# Patient Record
Sex: Male | Born: 1980 | Race: White | Hispanic: No | Marital: Married | Smoking: Current every day smoker
Health system: Southern US, Community
[De-identification: ages and names within clinical notes are randomized; demographics above are authoritative.]

## PROBLEM LIST (undated history)

## (undated) DIAGNOSIS — F319 Bipolar disorder, unspecified: Secondary | ICD-10-CM

---

## 2004-06-03 ENCOUNTER — Other Ambulatory Visit: Payer: Self-pay

## 2004-09-23 ENCOUNTER — Other Ambulatory Visit: Payer: Self-pay

## 2004-09-23 ENCOUNTER — Emergency Department: Payer: Self-pay | Admitting: Emergency Medicine

## 2005-01-24 ENCOUNTER — Emergency Department: Payer: Self-pay | Admitting: Unknown Physician Specialty

## 2005-05-29 ENCOUNTER — Emergency Department: Payer: Self-pay | Admitting: General Practice

## 2006-01-19 ENCOUNTER — Emergency Department: Payer: Self-pay | Admitting: Internal Medicine

## 2006-01-19 ENCOUNTER — Other Ambulatory Visit: Payer: Self-pay

## 2007-07-05 ENCOUNTER — Emergency Department: Payer: Self-pay | Admitting: Emergency Medicine

## 2007-07-06 ENCOUNTER — Emergency Department: Payer: Self-pay | Admitting: Emergency Medicine

## 2007-08-25 ENCOUNTER — Emergency Department: Payer: Self-pay | Admitting: Emergency Medicine

## 2007-11-04 ENCOUNTER — Emergency Department: Payer: Self-pay | Admitting: Emergency Medicine

## 2008-05-14 ENCOUNTER — Emergency Department (HOSPITAL_COMMUNITY): Admission: EM | Admit: 2008-05-14 | Discharge: 2008-05-15 | Payer: Self-pay | Admitting: Emergency Medicine

## 2008-05-14 ENCOUNTER — Emergency Department (HOSPITAL_COMMUNITY): Admission: EM | Admit: 2008-05-14 | Discharge: 2008-05-14 | Payer: Self-pay | Admitting: Emergency Medicine

## 2009-01-25 ENCOUNTER — Emergency Department (HOSPITAL_COMMUNITY): Admission: EM | Admit: 2009-01-25 | Discharge: 2009-01-26 | Payer: Self-pay | Admitting: Emergency Medicine

## 2009-05-18 ENCOUNTER — Emergency Department (HOSPITAL_COMMUNITY): Admission: EM | Admit: 2009-05-18 | Discharge: 2009-05-19 | Payer: Self-pay | Admitting: Emergency Medicine

## 2009-05-20 ENCOUNTER — Emergency Department (HOSPITAL_COMMUNITY): Admission: EM | Admit: 2009-05-20 | Discharge: 2009-05-20 | Payer: Self-pay | Admitting: Family Medicine

## 2010-10-13 ENCOUNTER — Emergency Department (HOSPITAL_COMMUNITY): Admission: EM | Admit: 2010-10-13 | Discharge: 2010-10-13 | Payer: Self-pay | Admitting: Emergency Medicine

## 2011-02-18 LAB — RAPID URINE DRUG SCREEN, HOSP PERFORMED
Amphetamines: NOT DETECTED
Benzodiazepines: NOT DETECTED
Tetrahydrocannabinol: NOT DETECTED

## 2011-03-25 LAB — CBC
MCV: 90.8 fL (ref 78.0–100.0)
Platelets: 158 10*3/uL (ref 150–400)
RBC: 5.18 MIL/uL (ref 4.22–5.81)
WBC: 5.8 10*3/uL (ref 4.0–10.5)

## 2011-03-25 LAB — COMPREHENSIVE METABOLIC PANEL
ALT: 25 U/L (ref 0–53)
AST: 46 U/L — ABNORMAL HIGH (ref 0–37)
Albumin: 3.9 g/dL (ref 3.5–5.2)
CO2: 26 mEq/L (ref 19–32)
Calcium: 8.6 mg/dL (ref 8.4–10.5)
Chloride: 97 mEq/L (ref 96–112)
Creatinine, Ser: 0.89 mg/dL (ref 0.4–1.5)
GFR calc Af Amer: >60 mL/min (ref >60)
GFR calc non Af Amer: >60 mL/min (ref >60)
Sodium: 133 mEq/L — ABNORMAL LOW (ref 135–145)

## 2011-03-25 LAB — URINALYSIS, ROUTINE W REFLEX MICROSCOPIC
Bilirubin Urine: NEGATIVE
Glucose, UA: NEGATIVE mg/dL
Hgb urine dipstick: NEGATIVE
Ketones, ur: NEGATIVE mg/dL
Specific Gravity, Urine: 1.017 (ref 1.005–1.030)
pH: 6.5 (ref 5.0–8.0)

## 2011-03-25 LAB — DIFFERENTIAL
Eosinophils Absolute: 0.2 10*3/uL (ref 0.0–0.7)
Eosinophils Relative: 3 % (ref 0–5)
Lymphocytes Relative: 38 % (ref 12–46)
Lymphs Abs: 2.2 10*3/uL (ref 0.7–4.0)
Monocytes Absolute: 0.7 10*3/uL (ref 0.1–1.0)

## 2011-09-04 LAB — COMPREHENSIVE METABOLIC PANEL
ALT: 17
BUN: 7
CO2: 30
Calcium: 9.2
Creatinine, Ser: 1.06
GFR calc non Af Amer: >60
Glucose, Bld: 84
Sodium: 137

## 2011-09-04 LAB — POCT I-STAT, CHEM 8
Calcium, Ion: 1.22
Creatinine, Ser: 1.2
Glucose, Bld: 33 — CL
Glucose, Bld: 71
HCT: 47
Hemoglobin: 16
Hemoglobin: 16
Sodium: 138
TCO2: 29
TCO2: 29

## 2011-09-04 LAB — URINALYSIS, ROUTINE W REFLEX MICROSCOPIC
Ketones, ur: 15 — AB
Leukocytes, UA: NEGATIVE
Nitrite: NEGATIVE
Protein, ur: 30 — AB
pH: 6.5

## 2011-09-04 LAB — DIFFERENTIAL
Eosinophils Absolute: 0.1
Lymphs Abs: 1.4
Neutro Abs: 9.2 — ABNORMAL HIGH
Neutrophils Relative %: 72

## 2011-09-04 LAB — CBC
HCT: 45.2
Hemoglobin: 16
MCHC: 35.3
MCV: 91.7
RBC: 4.93
RDW: 13.4

## 2011-09-04 LAB — LIPASE, BLOOD: Lipase: 21

## 2013-02-28 ENCOUNTER — Emergency Department: Payer: Self-pay | Admitting: Emergency Medicine

## 2013-04-23 ENCOUNTER — Emergency Department: Payer: Self-pay | Admitting: Emergency Medicine

## 2013-04-27 ENCOUNTER — Ambulatory Visit: Payer: Self-pay | Admitting: Family Medicine

## 2013-05-19 ENCOUNTER — Ambulatory Visit: Payer: Self-pay

## 2013-07-01 ENCOUNTER — Emergency Department: Payer: Self-pay | Admitting: Emergency Medicine

## 2013-07-02 ENCOUNTER — Emergency Department: Payer: Self-pay | Admitting: Emergency Medicine

## 2013-08-08 ENCOUNTER — Emergency Department: Payer: Self-pay | Admitting: Emergency Medicine

## 2013-08-20 ENCOUNTER — Emergency Department: Payer: Self-pay | Admitting: Emergency Medicine

## 2013-08-25 ENCOUNTER — Ambulatory Visit: Payer: Self-pay | Admitting: Physical Medicine and Rehabilitation

## 2014-01-02 ENCOUNTER — Emergency Department: Payer: Self-pay | Admitting: Emergency Medicine

## 2015-04-25 ENCOUNTER — Encounter: Payer: Self-pay | Admitting: *Deleted

## 2015-04-25 ENCOUNTER — Emergency Department
Admission: EM | Admit: 2015-04-25 | Discharge: 2015-04-25 | Disposition: A | Discharge status: Home / Self Care | Payer: Medicaid Other | Attending: Emergency Medicine | Admitting: Emergency Medicine

## 2015-04-25 DIAGNOSIS — M791 Myalgia, unspecified site: Secondary | ICD-10-CM

## 2015-04-25 DIAGNOSIS — Z79899 Other long term (current) drug therapy: Secondary | ICD-10-CM | POA: Insufficient documentation

## 2015-04-25 DIAGNOSIS — Z720 Tobacco use: Secondary | ICD-10-CM | POA: Diagnosis not present

## 2015-04-25 DIAGNOSIS — R5383 Other fatigue: Secondary | ICD-10-CM | POA: Diagnosis present

## 2015-04-25 HISTORY — DX: Bipolar disorder, unspecified: F31.9

## 2015-04-25 LAB — CBC
HCT: 44.6 % (ref 40.0–52.0)
HEMOGLOBIN: 15 g/dL (ref 13.0–18.0)
MCH: 31.4 pg (ref 26.0–34.0)
MCHC: 33.7 g/dL (ref 32.0–36.0)
MCV: 93.2 fL (ref 80.0–100.0)
Platelets: 165 10*3/uL (ref 150–440)
RBC: 4.79 MIL/uL (ref 4.40–5.90)
RDW: 12.9 % (ref 11.5–14.5)
WBC: 7.3 10*3/uL (ref 3.8–10.6)

## 2015-04-25 LAB — BASIC METABOLIC PANEL
Anion gap: 7 (ref 5–15)
BUN: 10 mg/dL (ref 6–20)
CALCIUM: 8.8 mg/dL — AB (ref 8.9–10.3)
CO2: 28 mmol/L (ref 22–32)
Chloride: 102 mmol/L (ref 101–111)
Creatinine, Ser: 0.91 mg/dL (ref 0.61–1.24)
GFR calc Af Amer: >60 mL/min (ref >60)
GFR calc non Af Amer: >60 mL/min (ref >60)
GLUCOSE: 91 mg/dL (ref 65–99)
Potassium: 3.5 mmol/L (ref 3.5–5.1)
Sodium: 137 mmol/L (ref 135–145)

## 2015-04-25 LAB — CK: Total CK: 319 U/L (ref 49–397)

## 2015-04-25 MED ORDER — KETOROLAC TROMETHAMINE 30 MG/ML IJ SOLN
30.0000 mg | Freq: Once | INTRAMUSCULAR | Status: AC
  Administered 2015-04-25: 30 mg via INTRAVENOUS

## 2015-04-25 MED ORDER — IBUPROFEN 800 MG PO TABS: 800.0000 mg | ORAL_TABLET | Freq: Three times a day (TID) | ORAL | Status: DC | PRN

## 2015-04-25 MED ORDER — KETOROLAC TROMETHAMINE 30 MG/ML IJ SOLN
INTRAMUSCULAR | Status: AC
  Administered 2015-04-25: 30 mg via INTRAVENOUS
  Filled 2015-04-25: qty 1

## 2015-04-25 MED ORDER — DOXYCYCLINE HYCLATE 100 MG PO CAPS: 100.0000 mg | ORAL_CAPSULE | Freq: Two times a day (BID) | ORAL | Status: DC

## 2015-04-25 MED ORDER — SODIUM CHLORIDE 0.9 % IV SOLN
Freq: Once | INTRAVENOUS | Status: AC
  Administered 2015-04-25: 13:00:00 via INTRAVENOUS

## 2015-04-25 NOTE — Discharge Instructions (Signed)

## 2015-04-25 NOTE — ED Notes (Signed)
Pt stable for discharge, prescriptions provided.

## 2015-04-25 NOTE — ED Provider Notes (Addendum)
[facility] Emergency Department Provider Note     Time seen: 12:45 PM  I have reviewed the triage vital signs and the nursing notes.   HISTORY  Chief Complaint Fatigue    HPI Carl Carlson is a 34 y.o. male resents for aching and not feeling well. Patient reports he can't sleep very well, he thinks his been going on since taken Seroquel for the last couple years. States he only takes half tablet because all tablet makes him sleep to much. He is unable to think as clearly as he normally does, it is been forgetful. His also on an antidepressant meters or murmur the name. Aching is generalized nothing makes it better or worse.     Past Medical History  Diagnosis Date  . Bipolar 1 disorder     There are no active problems to display for this patient.   History reviewed. No pertinent past surgical history.  Current Outpatient Rx  Name  Route  Sig  Dispense  Refill  . QUEtiapine (SEROQUEL) 50 MG tablet   Oral   Take 150 mg by mouth at bedtime.           Allergies Penicillins and Septra  No family history on file.  Social History History  Substance Use Topics  . Smoking status: Current Every Day Smoker  . Smokeless tobacco: Not on file  . Alcohol Use: Yes     Comment: no etoh past 2 weeks, used to drink a case of beer over a few days    Review of Systems Constitutional: Negative for fever. Eyes: Negative for visual changes. ENT: Negative for sore throat. Cardiovascular: Negative for chest pain. Respiratory: Negative for shortness of breath. Gastrointestinal: Negative for abdominal pain, vomiting and diarrhea. Genitourinary: Negative for dysuria. Musculoskeletal: Positive for generalized  aches and pains Skin: Negative for rash. Neurological: Negative for headaches, focal weakness or numbness.  10-point ROS otherwise negative.  ____________________________________________   PHYSICAL EXAM:  VITAL SIGNS: ED Triage Vitals   Enc Vitals Group     BP 04/25/15 1151 125/75 mmHg     Pulse Rate 04/25/15 1151 57     Resp 04/25/15 1151 18     Temp 04/25/15 1151 98.2 F (36.8 C)     Temp Source 04/25/15 1151 Oral     SpO2 04/25/15 1151 99 %     Weight 04/25/15 1151 180 lb (81.647 kg)     Height 04/25/15 1151 6\' 3"  (1.905 m)     Head Cir --      Peak Flow --      Pain Score 04/25/15 1211 5     Pain Loc --      Pain Edu? --      Excl. in GC? --     Constitutional: Alert and oriented. Well appearing and in no distress. Eyes: Conjunctivae are normal. PERRL. Normal extraocular movements. ENT   Head: Normocephalic and atraumatic.   Nose: No congestion/rhinnorhea.   Mouth/Throat: Mucous membranes are moist.   Neck: No stridor. Hematological/Lymphatic/Immunilogical: No cervical lymphadenopathy. Cardiovascular: Normal rate, regular rhythm. Normal and symmetric distal pulses are present in all extremities. No murmurs, rubs, or gallops. Respiratory: Normal respiratory effort without tachypnea nor retractions. Breath sounds are clear and equal bilaterally. No wheezes/rales/rhonchi. Gastrointestinal: Soft and nontender. No distention. No abdominal bruits. There is no CVA tenderness. Musculoskeletal: Nontender with normal range of motion in all extremities. No joint effusions.  No lower extremity tenderness nor edema. Neurologic:  Normal speech and  language. No gross focal neurologic deficits are appreciated. Speech is normal. No gait instability. Skin:  Patient does have several skin lesions that he notes were from recent tick bites, no rashes appreciated. Psychiatric: Mood and affect are normal. Speech and behavior are normal. Patient exhibits appropriate insight and judgment.  ____________________________________________    LABS (pertinent positives/negatives)  Labs Reviewed  BASIC METABOLIC PANEL - Abnormal; Notable for the following:    Calcium 8.8 (*)    All other components within normal limits   CK  CBC    ____________________________________________  ED COURSE:  Pertinent labs & imaging results that were available during my care of the patient were reviewed by me and considered in my medical decision making (see chart for details).  Given IV fluid bolus, Toradol and check lab works including CK level ____________________________________________   RADIOLOGY  None  ____________________________________________    FINAL ASSESSMENT AND PLAN  Myalgias  Plan: I'm unsure the exact etiology of his myalgias. I'll assume that is at least possible this could be a tickborne illness related. He has had no rash no fever, but he said significant myalgias which have been unusual for the last 2-3 weeks. He does note tick bites were at least 3 weeks ago. [place], we'll try doxycycline and have him follow-up either here or with his regular doctor is not improving.    Emily FilbertWilliams, Jonathan E, MD   Emily FilbertJonathan E Williams, MD 04/25/15 1350  Emily FilbertJonathan E Williams, MD 04/25/15 1400

## 2015-04-25 NOTE — ED Notes (Signed)
Reports feeling achy and not feeling well. Reports not being able to sleep well. Thinks its because of Seroquel.  Takes only a half a tablet because a whole tablet makes him sleep too much. States he is not able to think as clearly as he normally does.  He is forgetful some days. Has been on Seroquel for a couple of years 150mg  but only takes 75mg . He is also on an antidepressant as well does not know the name of it however. Sees Dr. Lesle ChrisLatiff on [address].

## 2015-05-24 ENCOUNTER — Encounter: Payer: Self-pay | Admitting: *Deleted

## 2015-05-24 ENCOUNTER — Emergency Department
Admission: EM | Admit: 2015-05-24 | Discharge: 2015-05-24 | Disposition: A | Discharge status: Home / Self Care | Payer: Medicaid Other | Attending: Emergency Medicine | Admitting: Emergency Medicine

## 2015-05-24 ENCOUNTER — Emergency Department: Payer: Medicaid Other

## 2015-05-24 DIAGNOSIS — Z880 Allergy status to penicillin: Secondary | ICD-10-CM | POA: Diagnosis not present

## 2015-05-24 DIAGNOSIS — Z720 Tobacco use: Secondary | ICD-10-CM | POA: Diagnosis not present

## 2015-05-24 DIAGNOSIS — J189 Pneumonia, unspecified organism: Secondary | ICD-10-CM

## 2015-05-24 DIAGNOSIS — R509 Fever, unspecified: Secondary | ICD-10-CM | POA: Diagnosis present

## 2015-05-24 DIAGNOSIS — J159 Unspecified bacterial pneumonia: Secondary | ICD-10-CM | POA: Diagnosis not present

## 2015-05-24 DIAGNOSIS — R079 Chest pain, unspecified: Secondary | ICD-10-CM | POA: Insufficient documentation

## 2015-05-24 MED ORDER — ONDANSETRON 8 MG PO TBDP
8.0000 mg | ORAL_TABLET | Freq: Once | ORAL | Status: AC
  Administered 2015-05-24: 8 mg via ORAL

## 2015-05-24 MED ORDER — PSEUDOEPH-BROMPHEN-DM 30-2-10 MG/5ML PO SYRP: 5.0000 mL | ORAL_SOLUTION | Freq: Four times a day (QID) | ORAL | Status: DC | PRN

## 2015-05-24 MED ORDER — KETOROLAC TROMETHAMINE 60 MG/2ML IM SOLN
INTRAMUSCULAR | Status: AC
  Administered 2015-05-24: 60 mg via INTRAMUSCULAR
  Filled 2015-05-24: qty 2

## 2015-05-24 MED ORDER — KETOROLAC TROMETHAMINE 60 MG/2ML IM SOLN
60.0000 mg | Freq: Once | INTRAMUSCULAR | Status: AC
Start: 2015-05-24 — End: 2015-05-24
  Administered 2015-05-24: 60 mg via INTRAMUSCULAR

## 2015-05-24 MED ORDER — IBUPROFEN 800 MG PO TABS: 800.0000 mg | ORAL_TABLET | Freq: Three times a day (TID) | ORAL | Status: DC | PRN

## 2015-05-24 MED ORDER — ONDANSETRON 4 MG PO TBDP
ORAL_TABLET | ORAL | Status: AC
  Administered 2015-05-24: 8 mg via ORAL
  Filled 2015-05-24: qty 2

## 2015-05-24 MED ORDER — AZITHROMYCIN 250 MG PO TABS: ORAL_TABLET | ORAL | Status: AC

## 2015-05-24 NOTE — ED Notes (Signed)
Pt reports fever, body aches, chills, nausea and vomiting for the last week

## 2015-05-24 NOTE — ED Provider Notes (Signed)
[facility] Emergency Department Provider Note  ____________________________________________  Time seen: Approximately 6:44 PM  I have reviewed the triage vital signs and the nursing notes.   HISTORY  Chief Complaint Chills    HPI SHASHWAT CLEARY is a 34 y.o. male complaining of 1 week of fever bodyaches chills nausea and vomiting. The patient say his symptoms wax and wane. State he was given Benadryl last 2 days but today everything became worse. Patient said his last vomiting episode was prior to coming to the emergency room. Patient also state he left work early today secondary to his complaint. Patient rates his pain as a 10 over 10.   Past Medical History  Diagnosis Date  . Bipolar 1 disorder     There are no active problems to display for this patient.   History reviewed. No pertinent past surgical history.  Current Outpatient Rx  Name  Route  Sig  Dispense  Refill  . doxycycline (VIBRAMYCIN) 100 MG capsule   Oral   Take 1 capsule (100 mg total) by mouth 2 (two) times daily.   28 capsule   0   . ibuprofen (ADVIL,MOTRIN) 800 MG tablet   Oral   Take 1 tablet (800 mg total) by mouth every 8 (eight) hours as needed.   30 tablet   0   . QUEtiapine (SEROQUEL) 50 MG tablet   Oral   Take 150 mg by mouth at bedtime.           Allergies Penicillins and Septra  No family history on file.  Social History History  Substance Use Topics  . Smoking status: Current Every Day Smoker -- 0.50 packs/day  . Smokeless tobacco: Not on file  . Alcohol Use: Yes     Comment: no etoh past 2 weeks, used to drink a case of beer over a few days    Review of Systems Constitutional: Fever chills and malaise  Eyes: No visual changes. ENT: No sore throat. Cardiovascular: Chest pain secondary to cough Respiratory: Denies shortness of breath. States deep breath causes increased coughing Gastrointestinal: No abdominal pain. States nausea and vomiting but  no diarrhea. No constipation. Genitourinary: Negative for dysuria. Musculoskeletal: Generalized joint pain Skin: Negative for rash. Neurological: Negative for headaches, focal weakness or numbness.  10-point ROS otherwise negative.  ____________________________________________   PHYSICAL EXAM:  VITAL SIGNS: ED Triage Vitals  Enc Vitals Group     BP 05/24/15 1758 140/62 mmHg     Pulse Rate 05/24/15 1758 105     Resp --      Temp 05/24/15 1758 100.3 F (37.9 C)     Temp Source 05/24/15 1758 Oral     SpO2 05/24/15 1758 96 %     Weight 05/24/15 1758 180 lb (81.647 kg)     Height 05/24/15 1758  (1.88 m)     Head Cir --      Peak Flow --      Pain Score 05/24/15 1759 10     Pain Loc --      Pain Edu? --      Excl. in GC? --     Constitutional: Alert and oriented. Mild distress. Low-grade fever Eyes: Conjunctivae are normal. PERRL. EOMI. Head: Atraumatic. Nose: Edematous nasal turbinates with clear rhinorrhea. Mouth/Throat: Mucous membranes are moist.  Oropharynx non-erythematous. Neck: No stridor.  No deformity for nuchal range of motion nontender palpation Hematological/Lymphatic/Immunilogical: No cervical lymphadenopathy. Cardiovascular: Normal rate, regular rhythm. Grossly normal heart sounds.  Good peripheral circulation. Mildly tachycardic Respiratory: Normal respiratory effort.  No retractions. Lungs bilateral Rales. Gastrointestinal: Soft and nontender. No distention. No abdominal bruits. No CVA tenderness. Musculoskeletal: No lower extremity tenderness nor edema.  No joint effusions. Neurologic:  Normal speech and language. No gross focal neurologic deficits are appreciated. Speech is normal. No gait instability. Skin:  Skin is warm, dry and intact. No rash noted. Psychiatric: Mood and affect are normal. Speech and behavior are normal.  ____________________________________________   LABS (all labs ordered are listed, but only abnormal results are  displayed)  Labs Reviewed - No data to display ____________________________________________  EKG   ____________________________________________  RADIOLOGY  Right perihilar pneumonia ____________________________________________   PROCEDURES  Procedure(s) performed: None  Critical Care performed: No  ____________________________________________   INITIAL IMPRESSION / ASSESSMENT AND PLAN / ED COURSE  Pertinent labs & imaging results that were available during my care of the patient were reviewed by me and considered in my medical decision making (see chart for details).  Viral illness ____________________________________________   FINAL CLINICAL IMPRESSION(S) / ED DIAGNOSES  Final diagnoses:  Community acquired pneumonia      Joni Reining, PA-C 05/24/15 1939  Governor Rooks, MD 05/24/15 [phone]

## 2016-04-23 ENCOUNTER — Encounter: Payer: Self-pay | Admitting: Emergency Medicine

## 2016-04-23 ENCOUNTER — Emergency Department
Admission: EM | Admit: 2016-04-23 | Discharge: 2016-04-23 | Disposition: A | Discharge status: Home / Self Care | Payer: Medicaid Other | Attending: Emergency Medicine | Admitting: Emergency Medicine

## 2016-04-23 DIAGNOSIS — F319 Bipolar disorder, unspecified: Secondary | ICD-10-CM | POA: Diagnosis not present

## 2016-04-23 DIAGNOSIS — N4889 Other specified disorders of penis: Secondary | ICD-10-CM

## 2016-04-23 DIAGNOSIS — A6920 Lyme disease, unspecified: Secondary | ICD-10-CM

## 2016-04-23 DIAGNOSIS — F17200 Nicotine dependence, unspecified, uncomplicated: Secondary | ICD-10-CM | POA: Insufficient documentation

## 2016-04-23 MED ORDER — DEXTROSE 5 % IV SOLN
2.0000 g | Freq: Once | INTRAVENOUS | Status: AC
  Administered 2016-04-23: 2 g via INTRAVENOUS
  Filled 2016-04-23: qty 2

## 2016-04-23 MED ORDER — PREDNISONE 20 MG PO TABS: 40.0000 mg | ORAL_TABLET | Freq: Every day | ORAL | Status: DC

## 2016-04-23 MED ORDER — METHYLPREDNISOLONE SODIUM SUCC 125 MG IJ SOLR
125.0000 mg | Freq: Once | INTRAMUSCULAR | Status: AC
  Administered 2016-04-23: 125 mg via INTRAVENOUS

## 2016-04-23 MED ORDER — DIPHENHYDRAMINE HCL 50 MG/ML IJ SOLN
INTRAMUSCULAR | Status: AC
  Administered 2016-04-23: 50 mg via INTRAVENOUS
  Filled 2016-04-23: qty 1

## 2016-04-23 MED ORDER — METHYLPREDNISOLONE SODIUM SUCC 125 MG IJ SOLR
INTRAMUSCULAR | Status: AC
  Administered 2016-04-23: 125 mg via INTRAVENOUS
  Filled 2016-04-23: qty 2

## 2016-04-23 MED ORDER — FAMOTIDINE IN NACL 20-0.9 MG/50ML-% IV SOLN
20.0000 mg | Freq: Once | INTRAVENOUS | Status: AC
  Administered 2016-04-23: 20 mg via INTRAVENOUS

## 2016-04-23 MED ORDER — DOXYCYCLINE HYCLATE 100 MG PO TABS: 100.0000 mg | ORAL_TABLET | Freq: Two times a day (BID) | ORAL | Status: DC

## 2016-04-23 MED ORDER — DIPHENHYDRAMINE HCL 50 MG/ML IJ SOLN
50.0000 mg | Freq: Once | INTRAMUSCULAR | Status: AC
  Administered 2016-04-23: 50 mg via INTRAVENOUS

## 2016-04-23 MED ORDER — FAMOTIDINE IN NACL 20-0.9 MG/50ML-% IV SOLN
INTRAVENOUS | Status: AC
  Administered 2016-04-23: 20 mg via INTRAVENOUS
  Filled 2016-04-23: qty 50

## 2016-04-23 NOTE — ED Notes (Signed)
Pt pulled a tick of his penis x2 days ago , yesterday the penis is swollen and painful, no difficulty urinating

## 2016-04-23 NOTE — ED Notes (Signed)
Pt reports itching, red rash to abdomen. MD to bedside, new orders placed.

## 2016-04-23 NOTE — ED Provider Notes (Signed)
[facility] Emergency Department Provider Note   ____________________________________________  Time seen: Approximately 9 AM  I have reviewed the triage vital signs and the nursing notes.   HISTORY  Chief Complaint Insect Bite   HPI Carl Carlson is a 35 y.o. male with a history of bipolar disorder who is presenting the emergency department with a swollen penis after being bitten by ticks 2 days ago. He said that he found 2 ticks on the tip of his penis. He removed both. He says that since then he has had penile swelling. He was seen at an urgent care yesterday where they sent blood work. The patient is unsure what blood tests they took.  He was also given minus cycling and recommended to put ice on his penis to help with swelling. He says that since then the swelling has worsened. He denies any pain. He also says the swelling to the right side of his groin. Denies any pain when urinating. Patient says he is also done a complete tick check of his armpits as well as his hair and groin and has not found any additional insects.   Past Medical History  Diagnosis Date  . Bipolar 1 disorder (HCC)     There are no active problems to display for this patient.   History reviewed. No pertinent past surgical history.  Current Outpatient Rx  Name  Route  Sig  Dispense  Refill  . minocycline (MINOCIN,DYNACIN) 100 MG capsule   Oral   Take 100 mg by mouth 2 (two) times daily.           Allergies Penicillins and Septra  No family history on file.  Social History Social History  Substance Use Topics  . Smoking status: Current Every Day Smoker -- 0.50 packs/day  . Smokeless tobacco: None  . Alcohol Use: Yes     Comment: no etoh past 2 weeks, used to drink a case of beer over a few days    Review of Systems Constitutional: No fever/chills Eyes: No visual changes. ENT: No sore throat. Cardiovascular: Denies chest pain. Respiratory: Denies shortness  of breath. Gastrointestinal: No abdominal pain.  No nausea, no vomiting.  No diarrhea.  No constipation. Genitourinary: Negative for dysuria. Musculoskeletal: Negative for back pain. Skin: As above Neurological: Negative for headaches, focal weakness or numbness.  10-point ROS otherwise negative.  ____________________________________________   PHYSICAL EXAM:  VITAL SIGNS: ED Triage Vitals  Enc Vitals Group     BP 04/23/16 0810 139/82 mmHg     Pulse Rate 04/23/16 0810 68     Resp 04/23/16 0810 18     Temp 04/23/16 0810 98 F (36.7 C)     Temp Source 04/23/16 0810 Oral     SpO2 04/23/16 0810 100 %     Weight 04/23/16 0810 183 lb (83.008 kg)     Height 04/23/16 0810  (1.905 m)     Head Cir --      Peak Flow --      Pain Score 04/23/16 0811 8     Pain Loc --      Pain Edu? --      Excl. in GC? --     Constitutional: Alert and oriented. Well appearing and in no acute distress. Eyes: Conjunctivae are normal. PERRL. EOMI. Head: Atraumatic. Nose: No congestion/rhinnorhea. Mouth/Throat: Mucous membranes are moist.  Neck: No stridor.   Cardiovascular: Normal rate, regular rhythm. Grossly normal heart sounds.   Respiratory: Normal respiratory  effort.  No retractions. Lungs CTAB. Gastrointestinal: Soft and nontender. No distention.  Genitourinary:  Penis is edematous and swollen especially to the glans and just before the glans. The patient is circumcised. There is also a 1 cm, rounded blackened scab lesion to the dorsal of the glans. There do not appear to be any bug parts embedded in the penis around this scabbed area in addition to an area just proximal on the anterior side of the penis to the glans. No tenderness to palpation. Palpable right inguinal lymph node which is mildly tender. Musculoskeletal: No lower extremity tenderness nor edema.  No joint effusions. Neurologic:  Normal speech and language. No gross focal neurologic deficits are appreciated.  Skin:  Skin is  warm, dry and intact. No rash noted. Psychiatric: Mood and affect are normal. Speech and behavior are normal.  ____________________________________________   LABS (all labs ordered are listed, but only abnormal results are displayed)  Labs Reviewed  LYME DISEASE DNA BY PCR(BORRELIA BURG)   ____________________________________________  EKG   ____________________________________________  RADIOLOGY   ____________________________________________   PROCEDURES    ____________________________________________   INITIAL IMPRESSION / ASSESSMENT AND PLAN / ED COURSE  Pertinent labs & imaging results that were available during my care of the patient were reviewed by me and considered in my medical decision making (see chart for details).  ----------------------------------------- 10:07 AM on 04/23/2016 -----------------------------------------  Patient's penis wrapped with an Ace wrap for compression for about 30 minutes without resolution of swelling. Also given Rocephin. Has a penicillin allergy listed but only hives as a reaction. The patient confirms this. However, he developed hives and a rash with itching with the Rocephin. No airway compromise. Given Benadryl as well as Pepcid and methylprednisolone with resolution of the itching. We'll monitor. I'm suspecting Lyme disease with possible cellulitis secondary to the insect bite. We'll give ceftriaxone as a single Dose and changes minocycline to doxycycline.  ----------------------------------------- 11:40 AM on 04/23/2016 -----------------------------------------  Patient had hives after ceftriaxone. Given that a drill as well as Pepcid and methylprednisolone and now is no longer itching and the rash has resolved. Penis still is swelling but without any worsening. Still nontender. Plan the patient and he'll be discharged home.   ____________________________________________   FINAL CLINICAL IMPRESSION(S) / ED  DIAGNOSES  Penile edema. Presumptive treatment for Lyme disease.    NEW MEDICATIONS STARTED DURING THIS VISIT:  New Prescriptions   No medications on file     Note:  This document was prepared using Dragon voice recognition software and may include unintentional dictation errors.    Myrna Blazeravid Matthew Jazon Jipson, MD 04/23/16 [phone]

## 2016-04-25 LAB — LYME DISEASE DNA BY PCR(BORRELIA BURG): Lyme Disease(B.burgdorferi)PCR: NEGATIVE

## 2016-09-21 ENCOUNTER — Emergency Department
Admission: EM | Admit: 2016-09-21 | Discharge: 2016-09-21 | Disposition: A | Discharge status: Home / Self Care | Payer: Medicaid Other | Attending: Emergency Medicine | Admitting: Emergency Medicine

## 2016-09-21 ENCOUNTER — Encounter: Payer: Self-pay | Admitting: Emergency Medicine

## 2016-09-21 ENCOUNTER — Emergency Department: Payer: Medicaid Other

## 2016-09-21 DIAGNOSIS — S99921A Unspecified injury of right foot, initial encounter: Secondary | ICD-10-CM | POA: Diagnosis present

## 2016-09-21 DIAGNOSIS — Y9301 Activity, walking, marching and hiking: Secondary | ICD-10-CM | POA: Diagnosis not present

## 2016-09-21 DIAGNOSIS — Y929 Unspecified place or not applicable: Secondary | ICD-10-CM | POA: Insufficient documentation

## 2016-09-21 DIAGNOSIS — W1842XA Slipping, tripping and stumbling without falling due to stepping into hole or opening, initial encounter: Secondary | ICD-10-CM | POA: Diagnosis not present

## 2016-09-21 DIAGNOSIS — F17200 Nicotine dependence, unspecified, uncomplicated: Secondary | ICD-10-CM | POA: Diagnosis not present

## 2016-09-21 DIAGNOSIS — Y999 Unspecified external cause status: Secondary | ICD-10-CM | POA: Insufficient documentation

## 2016-09-21 DIAGNOSIS — S93601A Unspecified sprain of right foot, initial encounter: Secondary | ICD-10-CM | POA: Insufficient documentation

## 2016-09-21 MED ORDER — NABUMETONE 750 MG PO TABS
750.0000 mg | ORAL_TABLET | Freq: Two times a day (BID) | ORAL | 0 refills | Status: DC
Start: 2016-09-21 — End: 2016-10-14

## 2016-09-21 MED ORDER — CYCLOBENZAPRINE HCL 5 MG PO TABS: 5.0000 mg | ORAL_TABLET | Freq: Three times a day (TID) | ORAL | 0 refills | Status: DC | PRN

## 2016-09-21 NOTE — ED Provider Notes (Signed)
[facility] Emergency Department Provider Note ____________________________________________  Time seen: 1412   I have reviewed the triage vital signs and the nursing notes.  HISTORY  Chief Complaint  Ankle Pain  HPI Carl Carlson is a 35 y.o. male reports pain to the lateral right foot after he stepped into a hole while walking across the yard yesterday. He was wearing his tennis shoes when rolled his ankle after stepping in the hole. He complains of lateral foot pain since then. He denies any other injury at this time.   Past Medical History:  Diagnosis Date  . Bipolar 1 disorder (HCC)     There are no active problems to display for this patient.  History reviewed. No pertinent surgical history.  Prior to Admission medications   Medication Sig Start Date End Date Taking? Authorizing Provider  cyclobenzaprine (FLEXERIL) 5 MG tablet Take 1 tablet (5 mg total) by mouth 3 (three) times daily as needed for muscle spasms. 09/21/16   Atif Chapple V Bacon Chistine Dematteo, PA-C  doxycycline (VIBRA-TABS) 100 MG tablet Take 1 tablet (100 mg total) by mouth 2 (two) times daily. 04/23/16   Myrna Blazer, MD  minocycline (MINOCIN,DYNACIN) 100 MG capsule Take 100 mg by mouth 2 (two) times daily.    Historical Provider, MD  nabumetone (RELAFEN) 750 MG tablet Take 1 tablet (750 mg total) by mouth 2 (two) times daily. 09/21/16   Gabor Lusk V Bacon Marian Meneely, PA-C  predniSONE (DELTASONE) 20 MG tablet Take 2 tablets (40 mg total) by mouth daily with breakfast. 04/23/16   Myrna Blazer, MD   Allergies Cephalosporins; Penicillins; and Septra [sulfamethoxazole-trimethoprim]  History reviewed. No pertinent family history.  Social History Social History  Substance Use Topics  . Smoking status: Current Every Day Smoker    Packs/day: 0.50  . Smokeless tobacco: Not on file  . Alcohol use Yes     Comment: no etoh past 2 weeks, used to drink a case of beer over a few days    Review of Systems  Constitutional: Negative for fever. Musculoskeletal: Negative for back pain. Left foot pain  Skin: Negative for rash. Neurological: Negative for headaches, focal weakness or numbness. ____________________________________________  PHYSICAL EXAM:  VITAL SIGNS: ED Triage Vitals  Enc Vitals Group     BP 09/21/16 1307 123/83     Pulse Rate 09/21/16 1307 (!) 102     Resp 09/21/16 1307 16     Temp 09/21/16 1307 98.1 F (36.7 C)     Temp Source 09/21/16 1307 Oral     SpO2 09/21/16 1307 96 %     Weight 09/21/16 1304 182 lb (82.6 kg)     Height 09/21/16 1304 6\' 2"  (1.88 m)     Head Circumference --      Peak Flow --      Pain Score 09/21/16 1304 10     Pain Loc --      Pain Edu? --      Excl. in GC? --    Constitutional: Alert and oriented. Well appearing and in no distress. Head: Normocephalic and atraumatic. Musculoskeletal: Right foot without obvious deformity, dislocation, or edema. No local erythema, abrasion, or swelling. Minimally tender to palp over the dorsolateral right foot. Nontender with normal range of motion in all extremities.  Neurologic: Normal speech and language. No gross focal neurologic deficits are appreciated. Skin:  Skin is warm, dry and intact. No rash noted. ____________________________________________   RADIOLOGY  Right Foot Negative  I, Favour Aleshire  Marcelyn BruinsV Bacon, personally viewed and evaluated these images (plain radiographs) as part of my medical decision making, as well as reviewing the written report by the radiologist. ___________________________________________  PROCEDURES  Ace bandage right foot ____________________________________________  INITIAL IMPRESSION / ASSESSMENT AND PLAN / ED COURSE  Patient with a sprain to the right foot without obvious fracture dislocation radiology. He is fitted with an Ace wrap and given instruction on RICE management of injury. He will follow up with Dr. Orland Jarredroxler podiatry for ongoing  management.  Clinical Course   ____________________________________________  FINAL CLINICAL IMPRESSION(S) / ED DIAGNOSES  Final diagnoses:  Sprain of right foot, initial encounter      Lissa HoardJenise V Bacon Coleton Woon, PA-C 09/21/16 1632    Sharyn CreamerMark Quale, MD 09/21/16 2201

## 2016-09-21 NOTE — ED Triage Notes (Signed)
Pt stepped in hole with right foot. C/o ankle pain. Denies swelling.

## 2016-09-21 NOTE — Discharge Instructions (Signed)
Your x-ray does not show a fracture to the foot. You have a sprain and should perform activities as tolerated. Take the prescription meds as directed. Follow-up with Dr. Orland Jarredroxler for continued symptoms.

## 2016-09-21 NOTE — ED Notes (Addendum)
States he stepped in a hole yesterday  having pain to right lateral foot ankle area  No  Swelling noted to ankle /foot  Positives pulses  But unable to beat wt

## 2016-10-14 ENCOUNTER — Ambulatory Visit
Admission: EM | Admit: 2016-10-14 | Discharge: 2016-10-14 | Disposition: A | Discharge status: Home / Self Care | Payer: Medicaid Other | Attending: Family Medicine | Admitting: Family Medicine

## 2016-10-14 DIAGNOSIS — T300 Burn of unspecified body region, unspecified degree: Secondary | ICD-10-CM

## 2016-10-14 DIAGNOSIS — L03115 Cellulitis of right lower limb: Secondary | ICD-10-CM | POA: Diagnosis not present

## 2016-10-14 MED ORDER — KETOROLAC TROMETHAMINE 60 MG/2ML IM SOLN
60.0000 mg | Freq: Once | INTRAMUSCULAR | Status: AC
  Administered 2016-10-14: 60 mg via INTRAMUSCULAR

## 2016-10-14 MED ORDER — TETANUS-DIPHTH-ACELL PERTUSSIS 5-2.5-18.5 LF-MCG/0.5 IM SUSP
0.5000 mL | Freq: Once | INTRAMUSCULAR | Status: AC
  Administered 2016-10-14: 0.5 mL via INTRAMUSCULAR

## 2016-10-14 MED ORDER — CLINDAMYCIN HCL 300 MG PO CAPS: 300.0000 mg | ORAL_CAPSULE | Freq: Three times a day (TID) | ORAL | 0 refills | Status: DC

## 2016-10-14 MED ORDER — HYDROCODONE-ACETAMINOPHEN 5-325 MG PO TABS: 1.0000 | ORAL_TABLET | Freq: Four times a day (QID) | ORAL | 0 refills | Status: DC | PRN
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
## 2016-10-17 ENCOUNTER — Telehealth: Payer: Self-pay | Admitting: *Deleted

## 2017-11-06 ENCOUNTER — Other Ambulatory Visit: Payer: Self-pay

## 2017-11-06 ENCOUNTER — Emergency Department
Admission: EM | Admit: 2017-11-06 | Discharge: 2017-11-06 | Disposition: A | Discharge status: Home / Self Care | Payer: Medicaid Other | Attending: Emergency Medicine | Admitting: Emergency Medicine

## 2017-11-06 ENCOUNTER — Encounter: Payer: Self-pay | Admitting: Emergency Medicine

## 2017-11-06 DIAGNOSIS — F17200 Nicotine dependence, unspecified, uncomplicated: Secondary | ICD-10-CM | POA: Insufficient documentation

## 2017-11-06 DIAGNOSIS — L509 Urticaria, unspecified: Secondary | ICD-10-CM | POA: Diagnosis present

## 2017-11-06 DIAGNOSIS — T781XXA Other adverse food reactions, not elsewhere classified, initial encounter: Secondary | ICD-10-CM | POA: Diagnosis not present

## 2017-11-06 MED ORDER — FAMOTIDINE 20 MG PO TABS
20.0000 mg | ORAL_TABLET | Freq: Once | ORAL | Status: AC
  Administered 2017-11-06: 20 mg via ORAL
  Filled 2017-11-06: qty 1

## 2017-11-06 MED ORDER — PREDNISONE 50 MG PO TABS: 50.0000 mg | ORAL_TABLET | Freq: Every day | ORAL | 0 refills | Status: DC

## 2017-11-06 MED ORDER — METHYLPREDNISOLONE SODIUM SUCC 125 MG IJ SOLR
125.0000 mg | Freq: Once | INTRAMUSCULAR | Status: AC
  Administered 2017-11-06: 125 mg via INTRAMUSCULAR
  Filled 2017-11-06: qty 2

## 2017-11-06 MED ORDER — DIPHENHYDRAMINE HCL 50 MG/ML IJ SOLN
50.0000 mg | Freq: Once | INTRAMUSCULAR | Status: AC
  Administered 2017-11-06: 50 mg via INTRAMUSCULAR
  Filled 2017-11-06: qty 1

## 2017-11-06 NOTE — ED Provider Notes (Signed)
[facility] Emergency Department Provider Note  ____________________________________________  Time seen: Approximately 8:53 PM  I have reviewed the triage vital signs and the nursing notes.   HISTORY  Chief Complaint Urticaria    HPI Carl Carlson is a 36 y.o. male who presents emergency department for hives and itching to his right side of the body.  Patient reports that he ate 2 slices of [employer] pizza and broke out into hives.  Patient denies any history of allergic reaction to his speech.  He has a history of allergic reactions to cephalosporins, penicillins, Bactrim.  Has not had any food allergies.  Patient denies any difficulty breathing or swallowing, no swelling of the face, lips, tongue.  Patient has not tried any medications for his allergic reaction prior to arrival.  No other complaints at this time.  Past Medical History:  Diagnosis Date  . Bipolar 1 disorder (HCC)     There are no active problems to display for this patient.   History reviewed. No pertinent surgical history.  Prior to Admission medications   Medication Sig Start Date End Date Taking? Authorizing Provider  clindamycin (CLEOCIN) 300 MG capsule Take 1 capsule (300 mg total) by mouth 3 (three) times daily. 10/14/16   Payton Mccallumonty, Orlando, MD  HYDROcodone-acetaminophen (NORCO/VICODIN) 5-325 MG tablet Take 1-2 tablets by mouth every 6 (six) hours as needed. 10/14/16   Payton Mccallumonty, [place], MD  ibuprofen (ADVIL,MOTRIN) 800 MG tablet Take 800 mg by mouth every 8 (eight) hours as needed.    [provider]  predniSONE (DELTASONE) 50 MG tablet Take 1 tablet (50 mg total) by mouth daily with breakfast. 11/06/17   Cuthriell, Delorise RoyalsJonathan D, PA-C    Allergies Cephalosporins; Penicillins; and Septra [sulfamethoxazole-trimethoprim]  No family history on file.  Social History Social History   Tobacco Use  . Smoking status: Current Every Day Smoker    Packs/day: 0.50  . Smokeless tobacco:  Never Used  Substance Use Topics  . Alcohol use: Yes    Comment: no etoh past 2 weeks, used to drink a case of beer over a few days  . Drug use: No     Review of Systems  Constitutional: No fever/chills Eyes: No visual changes. No discharge ENT: No upper respiratory complaints. Cardiovascular: no chest pain. Respiratory: no cough. No SOB. Gastrointestinal: No abdominal pain.  No nausea, no vomiting.   Musculoskeletal: Negative for musculoskeletal pain. Skin: Positive for hives and pruritus to the right side of body Neurological: Negative for headaches, focal weakness or numbness. 10-point ROS otherwise negative.  ____________________________________________   PHYSICAL EXAM:  VITAL SIGNS: ED Triage Vitals  Enc Vitals Group     BP 11/06/17 2042 (!) 145/88     Pulse Rate 11/06/17 2042 (!) 108     Resp 11/06/17 2042 18     Temp 11/06/17 2042 98.1 F (36.7 C)     Temp Source 11/06/17 2042 Oral     SpO2 11/06/17 2042 96 %     Weight 11/06/17 2040 180 lb (81.6 kg)     Height 11/06/17 2040 6\' 2"  (1.88 m)     Head Circumference --      Peak Flow --      Pain Score 11/06/17 2040 0     Pain Loc --      Pain Edu? --      Excl. in GC? --      Constitutional: Alert and oriented. Well appearing and in no acute distress. Eyes: Conjunctivae are  normal. PERRL. EOMI. Head: Atraumatic. ENT:      Ears:       Nose: No congestion/rhinnorhea.      Mouth/Throat: Mucous membranes are moist.  No angioedema.  Oropharynx is nonerythematous and nonedematous. Neck: No stridor.    Cardiovascular: Normal rate, regular rhythm. Normal S1 and S2.  Good peripheral circulation. Respiratory: Normal respiratory effort without tachypnea or retractions. Lungs CTAB. Good air entry to the bases with no decreased or absent breath sounds. Musculoskeletal: Full range of motion to all extremities. No gross deformities appreciated. Neurologic:  Normal speech and language. No gross focal neurologic deficits  are appreciated.  Skin:  Skin is warm, dry and intact.  Scattered hives noted on the right side of the torso and right lower extremity.  Excoriations from scratching.  No other lesions noted Psychiatric: Mood and affect are normal. Speech and behavior are normal. Patient exhibits appropriate insight and judgement.   ____________________________________________   LABS (all labs ordered are listed, but only abnormal results are displayed)  Labs Reviewed - No data to display ____________________________________________  EKG   ____________________________________________  RADIOLOGY   No results found.  ____________________________________________    PROCEDURES  Procedure(s) performed:    Procedures    Medications  methylPREDNISolone sodium succinate (SOLU-MEDROL) 125 mg/2 mL injection 125 mg (125 mg Intramuscular Given 11/06/17 2102)  diphenhydrAMINE (BENADRYL) injection 50 mg (50 mg Intramuscular Given 11/06/17 2104)  famotidine (PEPCID) tablet 20 mg (20 mg Oral Given 11/06/17 2101)     ____________________________________________   INITIAL IMPRESSION / ASSESSMENT AND PLAN / ED COURSE  Pertinent labs & imaging results that were available during my care of the patient were reviewed by me and considered in my medical decision making (see chart for details).  Review of the [place] CSRS was performed in accordance of the [facility] prior to dispensing any controlled drugs.     Patient's diagnosis is consistent with allergic reaction to food.  Patient presents with a scattered hives on the right side of his body after eating pizza.  Patient denies any difficulty breathing or swallowing.  Exam is reassuring with no, no wheezing.  Patient given injections of Solu-Medrol, diphenhydramine, oral famotidine.. Patient will be discharged home with prescriptions for prednisone for 5 days and to take diphenhydramine at home.. Patient is to follow up with primary care as needed or otherwise  directed. Patient is given ED precautions to return to the ED for any worsening or new symptoms.     ____________________________________________  FINAL CLINICAL IMPRESSION(S) / ED DIAGNOSES  Final diagnoses:  Allergic reaction to food, initial encounter      NEW MEDICATIONS STARTED DURING THIS VISIT:  ED Discharge Orders        Ordered    predniSONE (DELTASONE) 50 MG tablet  Daily with breakfast     11/06/17 2102          This chart was dictated using voice recognition software/Dragon. Despite best efforts to proofread, errors can occur which can change the meaning. Any change was purely unintentional.    Racheal PatchesCuthriell, Jonathan D, PA-C 11/07/17 0013    Arnaldo NatalMalinda, Paul F, MD 11/07/17 0030

## 2017-11-06 NOTE — ED Notes (Signed)
Patient reports he at a frozen pizza, and then began to have generalized itching/rash/uticaria.  Patient denies SOB, and throat tightness.

## 2017-11-06 NOTE — ED Notes (Signed)
Reviewed discharge instructions, follow-up care, and prescriptions with patient. Patient verbalized understanding of all information reviewed. Patient stable, with no distress noted at this time.  Patient reports complete relief of earlier symptoms.

## 2017-11-06 NOTE — ED Triage Notes (Signed)
Pt arrives POV with c/o hives from [employer] pizza. Pt has hives noted to right side and both feet. Pt is in NAD with no breathing difficulty noted.

## 2018-01-02 ENCOUNTER — Other Ambulatory Visit: Payer: Self-pay

## 2018-01-02 ENCOUNTER — Encounter: Payer: Self-pay | Admitting: Gynecology

## 2018-01-02 ENCOUNTER — Ambulatory Visit: Payer: Medicaid Other

## 2018-01-02 ENCOUNTER — Ambulatory Visit
Admission: EM | Admit: 2018-01-02 | Discharge: 2018-01-02 | Disposition: A | Discharge status: Home / Self Care | Payer: Medicaid Other | Attending: Family Medicine | Admitting: Family Medicine

## 2018-01-02 DIAGNOSIS — Z882 Allergy status to sulfonamides status: Secondary | ICD-10-CM | POA: Insufficient documentation

## 2018-01-02 DIAGNOSIS — Z880 Allergy status to penicillin: Secondary | ICD-10-CM | POA: Insufficient documentation

## 2018-01-02 DIAGNOSIS — M25531 Pain in right wrist: Secondary | ICD-10-CM | POA: Diagnosis not present

## 2018-01-02 DIAGNOSIS — Z881 Allergy status to other antibiotic agents status: Secondary | ICD-10-CM | POA: Diagnosis not present

## 2018-01-02 DIAGNOSIS — F17210 Nicotine dependence, cigarettes, uncomplicated: Secondary | ICD-10-CM | POA: Diagnosis not present

## 2018-01-02 DIAGNOSIS — F319 Bipolar disorder, unspecified: Secondary | ICD-10-CM | POA: Diagnosis not present

## 2018-01-02 MED ORDER — MELOXICAM 15 MG PO TABS: 15.0000 mg | ORAL_TABLET | Freq: Every day | ORAL | 0 refills | Status: DC | PRN

## 2018-01-02 NOTE — ED Provider Notes (Signed)
[facility]    CSN: [id number] Arrival date & time: 01/02/18  [id number]  History   Chief Complaint Chief Complaint  Patient presents with  . Wrist Pain   HPI  37 year old male presents with right wrist pain.  R Wrist pain  Started on Thursday.  Pain is located in the middle of the wrist.  Pain is severe, 10/10.  Worse with lifting objects.  It is making it difficult to lift and use his hand/wrist.  He does not recall a fall, trauma, or injury.  No medications or interventions tried.  No other associated symptoms.  No other complaints at this time.  Past Medical History:  Diagnosis Date  . Bipolar 1 disorder (HCC)    History reviewed. No pertinent surgical history.   Home Medications    Prior to Admission medications   Medication Sig Start Date End Date Taking? Authorizing Provider  meloxicam (MOBIC) 15 MG tablet Take 1 tablet (15 mg total) by mouth daily as needed. 01/02/18   Tommie Samsook, Intisar Claudio G, DO   Family History History reviewed. No pertinent family history.  Social History Social History   Tobacco Use  . Smoking status: Current Every Day Smoker    Packs/day: 0.50  . Smokeless tobacco: Never Used  Substance Use Topics  . Alcohol use: Yes    Comment: no etoh past 2 weeks, used to drink a case of beer over a few days  . Drug use: No   Allergies   Cephalosporins; Penicillins; and Septra [sulfamethoxazole-trimethoprim]   Review of Systems Review of Systems  Constitutional: Negative.   Musculoskeletal:       Right wrist pain. No injury. No swelling.   Physical Exam Triage Vital Signs ED Triage Vitals  Enc Vitals Group     BP 01/02/18 1005 (!) 125/55     Pulse Rate 01/02/18 1005 68     Resp 01/02/18 1005 16     Temp 01/02/18 1005 98 F (36.7 C)     Temp Source 01/02/18 1005 Oral     SpO2 01/02/18 1005 99 %     Weight 01/02/18 1007 182 lb (82.6 kg)     Height 01/02/18 1007 6\' 2"  (1.88 m)     Head Circumference --      Peak Flow --      Pain Score 01/02/18 1006 10     Pain Loc --      Pain Edu? --      Excl. in GC? --    Updated Vital Signs BP (!) 125/55 (BP Location: Left Arm)   Pulse 68   Temp 98 F (36.7 C) (Oral)   Resp 16   Ht 6\' 2"  (1.88 m)   Wt 182 lb (82.6 kg)   SpO2 99%   BMI 23.37 kg/m  Physical Exam  Constitutional: He is oriented to person, place, and time. He appears well-developed and well-nourished. No distress.  Cardiovascular: Normal rate and regular rhythm.  Pulmonary/Chest: Effort normal and breath sounds normal. He has no wheezes. He has no rales.  Musculoskeletal:  Right wrist: Inspection normal with no visible erythema or swelling. ROM normal.  Palpation - No snuffbox tenderness.  Patient tender in the center of the wrist, dorsally.   Neurological: He is alert and oriented to person, place, and time.  Psychiatric: He has a normal mood and affect. His behavior is normal.  Nursing note and vitals reviewed.  UC Treatments / Results  Labs (all labs ordered are listed, but only abnormal  results are displayed) Labs Reviewed - No data to display  EKG  EKG Interpretation None       Radiology Dg Wrist Complete Right  Result Date: 01/02/2018 CLINICAL DATA:  Right wrist pain for 3 days, no known injury, initial encounter EXAM: RIGHT WRIST - COMPLETE 3+ VIEW COMPARISON:  None. FINDINGS: There is no evidence of fracture or dislocation. There is no evidence of arthropathy or other focal bone abnormality. Soft tissues are unremarkable. IMPRESSION: No acute abnormality noted. Electronically Signed   By: Alcide Clever M.D.   On: 01/02/2018 11:03    Procedures Procedures (including critical care time)  Medications Ordered in UC Medications - No data to display   Initial Impression / Assessment and Plan / UC Course  I have reviewed the triage vital signs and the nursing notes.  Pertinent labs & imaging results that were available during my care of the patient were reviewed by me and  considered in my medical decision making (see chart for details).    37 year old male presents with wrist pain.  X-ray negative.  Etiology unclear.  Treating with meloxicam.  If no improvement, will need to see Ortho.  Final Clinical Impressions(s) / UC Diagnoses   Final diagnoses:  Right wrist pain    ED Discharge Orders        Ordered    meloxicam (MOBIC) 15 MG tablet  Daily PRN     01/02/18 1107     Controlled Substance Prescriptions [place] Controlled Substance Registry consulted? Not Applicable   Tommie Sams, DO 01/02/18 1112

## 2018-01-02 NOTE — Discharge Instructions (Signed)
Xray was negative.  Use the medication as directed.  Take care  Dr. Adriana Simasook

## 2018-01-02 NOTE — ED Triage Notes (Signed)
Patient c/o right wrist pain x 3 days. Patient deny any injury to the wrist.

## 2018-01-05 ENCOUNTER — Telehealth: Payer: Self-pay | Admitting: Emergency Medicine

## 2018-01-05 NOTE — Telephone Encounter (Signed)
Message left for patient to follow-up with [facility].

## 2018-02-07 ENCOUNTER — Emergency Department: Payer: Medicaid Other

## 2018-02-07 ENCOUNTER — Encounter: Payer: Self-pay | Admitting: Emergency Medicine

## 2018-02-07 ENCOUNTER — Other Ambulatory Visit: Payer: Self-pay

## 2018-02-07 ENCOUNTER — Emergency Department
Admission: EM | Admit: 2018-02-07 | Discharge: 2018-02-07 | Disposition: A | Discharge status: Home / Self Care | Payer: Medicaid Other | Attending: Emergency Medicine | Admitting: Emergency Medicine

## 2018-02-07 DIAGNOSIS — Y9301 Activity, walking, marching and hiking: Secondary | ICD-10-CM | POA: Insufficient documentation

## 2018-02-07 DIAGNOSIS — Y92828 Other wilderness area as the place of occurrence of the external cause: Secondary | ICD-10-CM | POA: Insufficient documentation

## 2018-02-07 DIAGNOSIS — Y998 Other external cause status: Secondary | ICD-10-CM | POA: Diagnosis not present

## 2018-02-07 DIAGNOSIS — S8991XA Unspecified injury of right lower leg, initial encounter: Secondary | ICD-10-CM | POA: Diagnosis present

## 2018-02-07 DIAGNOSIS — X509XXA Other and unspecified overexertion or strenuous movements or postures, initial encounter: Secondary | ICD-10-CM | POA: Diagnosis not present

## 2018-02-07 DIAGNOSIS — F17200 Nicotine dependence, unspecified, uncomplicated: Secondary | ICD-10-CM | POA: Insufficient documentation

## 2018-02-07 DIAGNOSIS — S82831A Other fracture of upper and lower end of right fibula, initial encounter for closed fracture: Secondary | ICD-10-CM | POA: Insufficient documentation

## 2018-02-07 MED ORDER — HYDROCODONE-ACETAMINOPHEN 5-325 MG PO TABS: 1.0000 | ORAL_TABLET | Freq: Four times a day (QID) | ORAL | 0 refills | Status: DC | PRN

## 2018-02-07 MED ORDER — HYDROCODONE-ACETAMINOPHEN 5-325 MG PO TABS
2.0000 | ORAL_TABLET | Freq: Once | ORAL | Status: AC
  Administered 2018-02-07: 2 via ORAL
  Filled 2018-02-07: qty 2

## 2018-02-07 MED ORDER — IBUPROFEN 600 MG PO TABS: 600.0000 mg | ORAL_TABLET | Freq: Three times a day (TID) | ORAL | 0 refills | Status: DC | PRN
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
## 2018-02-07 NOTE — Discharge Instructions (Signed)
Call make an appointment with Dr. Allena KatzPatel who is the orthopedist on call.  [facility].  Continue to wear OCL stirrup splint until seen by the orthopedist.  Norco 1 every 6 hours as needed for pain.  Ibuprofen 600 mg 3 times daily with food for pain and inflammation.  Use crutches for walking.  Ice and elevate your ankle to reduce swelling which will help with pain.
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
## 2018-02-07 NOTE — ED Notes (Signed)
FIRST NURSE NOTE:  Pt c/o ankle pain on the right, difficulty bearing weight. Pt in wheelchair.  Pt states injury happened last night.

## 2018-02-07 NOTE — ED Provider Notes (Addendum)
[facility] Emergency Department Provider Note  ____________________________________________   First MD Initiated Contact with Patient 02/07/18 [phone]     (approximate)  I have reviewed the triage vital signs and the nursing notes.   HISTORY  Chief Complaint Ankle Injury   HPI MAXAMILLIAN TIENDA is a 37 y.o. male here with complaint of right ankle pain.  Patient states that he was walking through the woods and to avoid a stick he states he rolled his ankle and heard a "pop".  Patient states that he has had limited ability to weight bear since his accident.  He denies any previous injury to his ankle.  He has not take any over-the-counter medication for his pain.  He denies any head injury during this event.  Patient rates his pain as 10/10.   Past Medical History:  Diagnosis Date  . Bipolar 1 disorder (HCC)     There are no active problems to display for this patient.   History reviewed. No pertinent surgical history.  Prior to Admission medications   Medication Sig Start Date End Date Taking? Authorizing Provider  HYDROcodone-acetaminophen (NORCO/VICODIN) 5-325 MG tablet Take 1 tablet by mouth every 6 (six) hours as needed for moderate pain. 02/07/18   Tommi Rumps, PA-C  ibuprofen (ADVIL,MOTRIN) 600 MG tablet Take 1 tablet (600 mg total) by mouth every 8 (eight) hours as needed. 02/07/18   Tommi Rumps, PA-C    Allergies Cephalosporins; Penicillins; and Septra [sulfamethoxazole-trimethoprim]  History reviewed. No pertinent family history.  Social History Social History   Tobacco Use  . Smoking status: Current Every Day Smoker    Packs/day: 0.50  . Smokeless tobacco: Never Used  Substance Use Topics  . Alcohol use: Yes    Comment: no etoh past 2 weeks, used to drink a case of beer over a few days  . Drug use: No    Review of Systems Constitutional: No fever/chills Eyes: No visual changes. Cardiovascular: Denies chest  pain. Respiratory: Denies shortness of breath. Musculoskeletal: Positive for right ankle pain. Skin: Negative for rash. Neurological: Negative for headaches, focal weakness or numbness. ____________________________________________   PHYSICAL EXAM:  VITAL SIGNS: ED Triage Vitals  Enc Vitals Group     BP 02/07/18 0906 (!) 142/99     Pulse Rate 02/07/18 0906 79     Resp 02/07/18 0906 18     Temp 02/07/18 0906 98 F (36.7 C)     Temp Source 02/07/18 0906 Oral     SpO2 02/07/18 0906 100 %     Weight 02/07/18 0903 180 lb (81.6 kg)     Height 02/07/18 0903 6\' 3"  (1.905 m)     Head Circumference --      Peak Flow --      Pain Score 02/07/18 0902 10     Pain Loc --      Pain Edu? --      Excl. in GC? --    Constitutional: Alert and oriented. Well appearing and in no acute distress. Eyes: Conjunctivae are normal.  Head: Atraumatic. Nose: No congestion/rhinnorhea. Neck: No stridor.   Cardiovascular: Normal rate, regular rhythm. Grossly normal heart sounds.  Good peripheral circulation. Respiratory: Normal respiratory effort.  No retractions. Lungs CTAB. Musculoskeletal: On examination of the right ankle there is marked tenderness with minimal edema at the lateral malleolus.  Range of motion is restricted completely secondary to patient's pain.  Patient is unable to tolerate exam.  Skin is intact.  Motor sensory  function intact. Neurologic:  Normal speech and language. No gross focal neurologic deficits are appreciated.  Skin:  Skin is warm, dry and intact.  No ecchymosis or abrasions seen. Psychiatric: Mood and affect are normal. Speech and behavior are normal.  ____________________________________________   LABS (all labs ordered are listed, but only abnormal results are displayed)  Labs Reviewed - No data to display  RADIOLOGY  ED MD interpretation:   Right ankle x-ray with soft tissue swelling more prominent over the lateral malleolus.  Official radiology report(s): Dg  Ankle Complete Right  Result Date: 02/07/2018 CLINICAL DATA:  Marthe Patch a pop following twisting injury. EXAM: RIGHT ANKLE - COMPLETE 3+ VIEW COMPARISON:  None. FINDINGS: Subtle linear lucency in the periphery of the distal tip of the lateral malleolus seen on one view only which may be projectional and artifactual versus a subtle nondisplaced fracture given the overlying soft tissue swelling. There is no other fracture or dislocation. The ankle mortise is intact. There is a small ankle joint effusion. The soft tissues are normal. IMPRESSION: Subtle linear lucency in the periphery of the distal tip of the lateral malleolus seen on one view only which may be projectional and artifactual versus a subtle nondisplaced fracture given the overlying soft tissue swelling. If there is further clinical concern, a repeat right ankle x-ray can be performed in 7-10 days. Electronically Signed   By: Elige Ko   On: 02/07/2018 09:46    ____________________________________________   PROCEDURES  Procedure(s) performed:   .Splint Application Date/Time: 02/07/2018 10:24 AM Performed by: Donell Beers, NT Authorized by: Tommi Rumps, PA-C   Consent:    Consent obtained:  Verbal   Consent given by:  Patient   Risks discussed:  Pain and swelling   Alternatives discussed:  Referral Pre-procedure details:    Sensation:  Normal Procedure details:    Laterality:  Right   Location:  Ankle   Ankle:  R ankle   Splint type:  Short leg   Supplies:  Ortho-Glass Post-procedure details:    Pain:  Unchanged   Sensation:  Normal   Patient tolerance of procedure:  Tolerated well, no immediate complications    Critical Care performed: No  ____________________________________________   INITIAL IMPRESSION / ASSESSMENT AND PLAN / ED COURSE  As part of my medical decision making, I reviewed the following data within the electronic MEDICAL RECORD NUMBER Notes from prior ED visits and [place] Controlled Substance  Database  Patient was given Norco 2 tablets while in the department.  Patient was placed in an OCL stirrup splint and given crutches.  He is to ice and elevate as needed for swelling and pain.  He was given a prescription for Norco to continue taking 1 every 6 hours as needed for pain and ibuprofen for pain and inflammation.  Patient is to follow-up with Dr. Ernest Pine who is the orthopedist on call.  ____________________________________________   FINAL CLINICAL IMPRESSION(S) / ED DIAGNOSES  Final diagnoses:  Other closed fracture of distal end of right fibula, initial encounter     ED Discharge Orders        Ordered    HYDROcodone-acetaminophen (NORCO/VICODIN) 5-325 MG tablet  Every 6 hours PRN     02/07/18 1011    ibuprofen (ADVIL,MOTRIN) 600 MG tablet  Every 8 hours PRN     02/07/18 1011       Note:  This document was prepared using Dragon voice recognition software and may include unintentional dictation errors.  Tommi RumpsSummers, Lashannon Bresnan L, PA-C 02/07/18 1018    Tommi RumpsSummers, Aaliyan Brinkmeier L, PA-C 02/07/18 1026    Schaevitz, Myra Rudeavid Matthew, MD 02/07/18 1044

## 2019-07-19 IMAGING — CR DG WRIST COMPLETE 3+V*R*
4 series · 4 of 4 positions shown · non-contrast
Comparison: None.

CLINICAL DATA: Right wrist pain for 3 days, no known injury,
initial encounter

EXAM:
RIGHT WRIST - COMPLETE 3+ VIEW

[wrist pa]
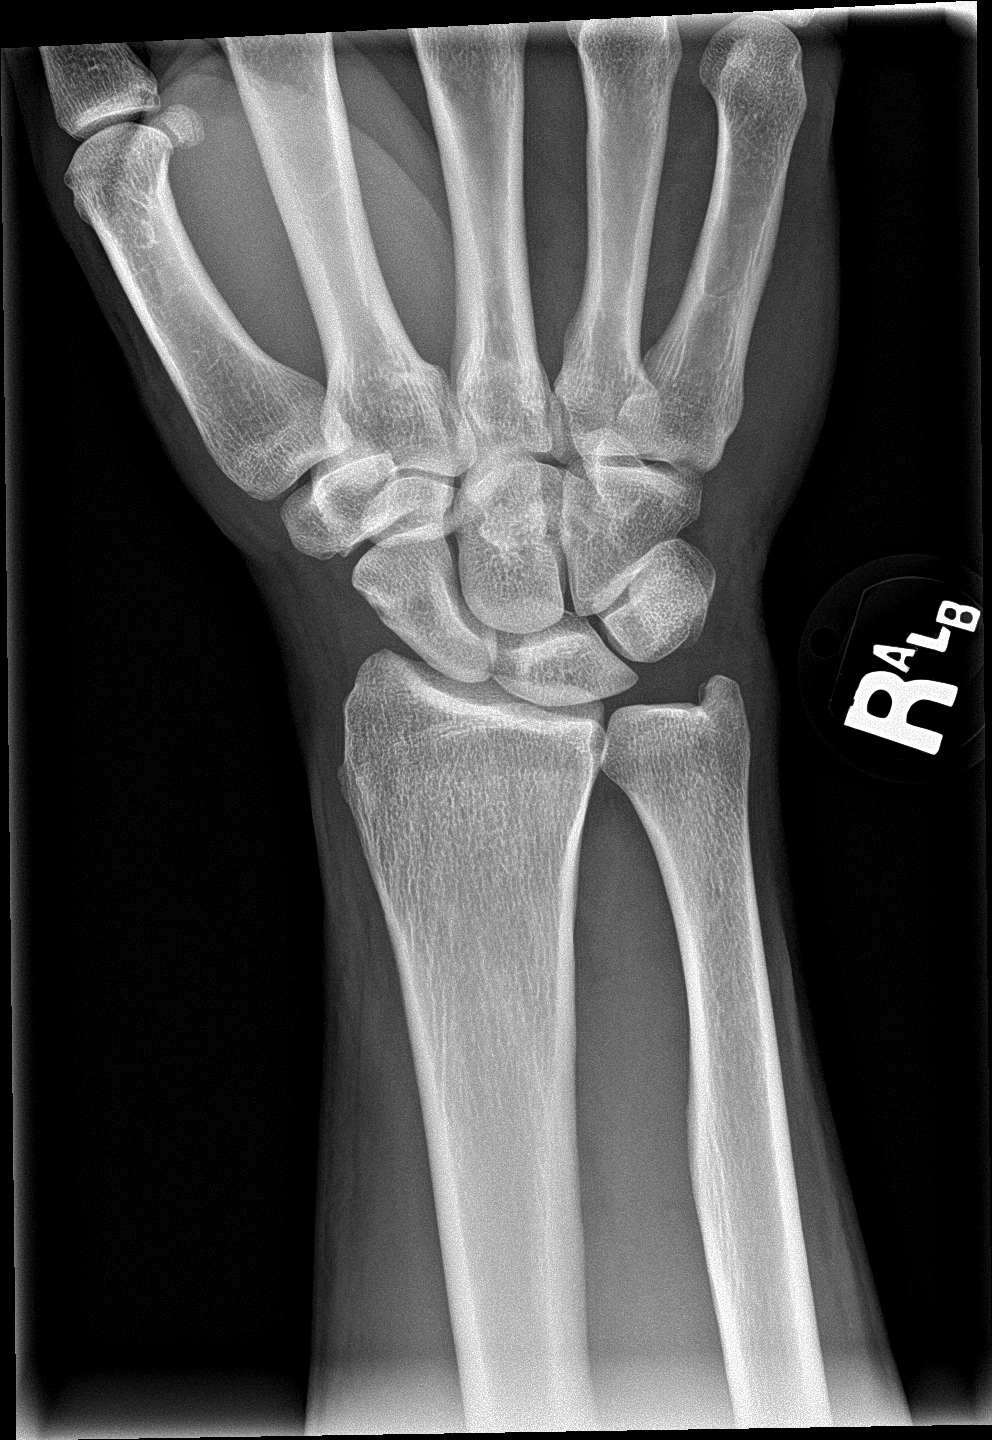

[wrist obl]
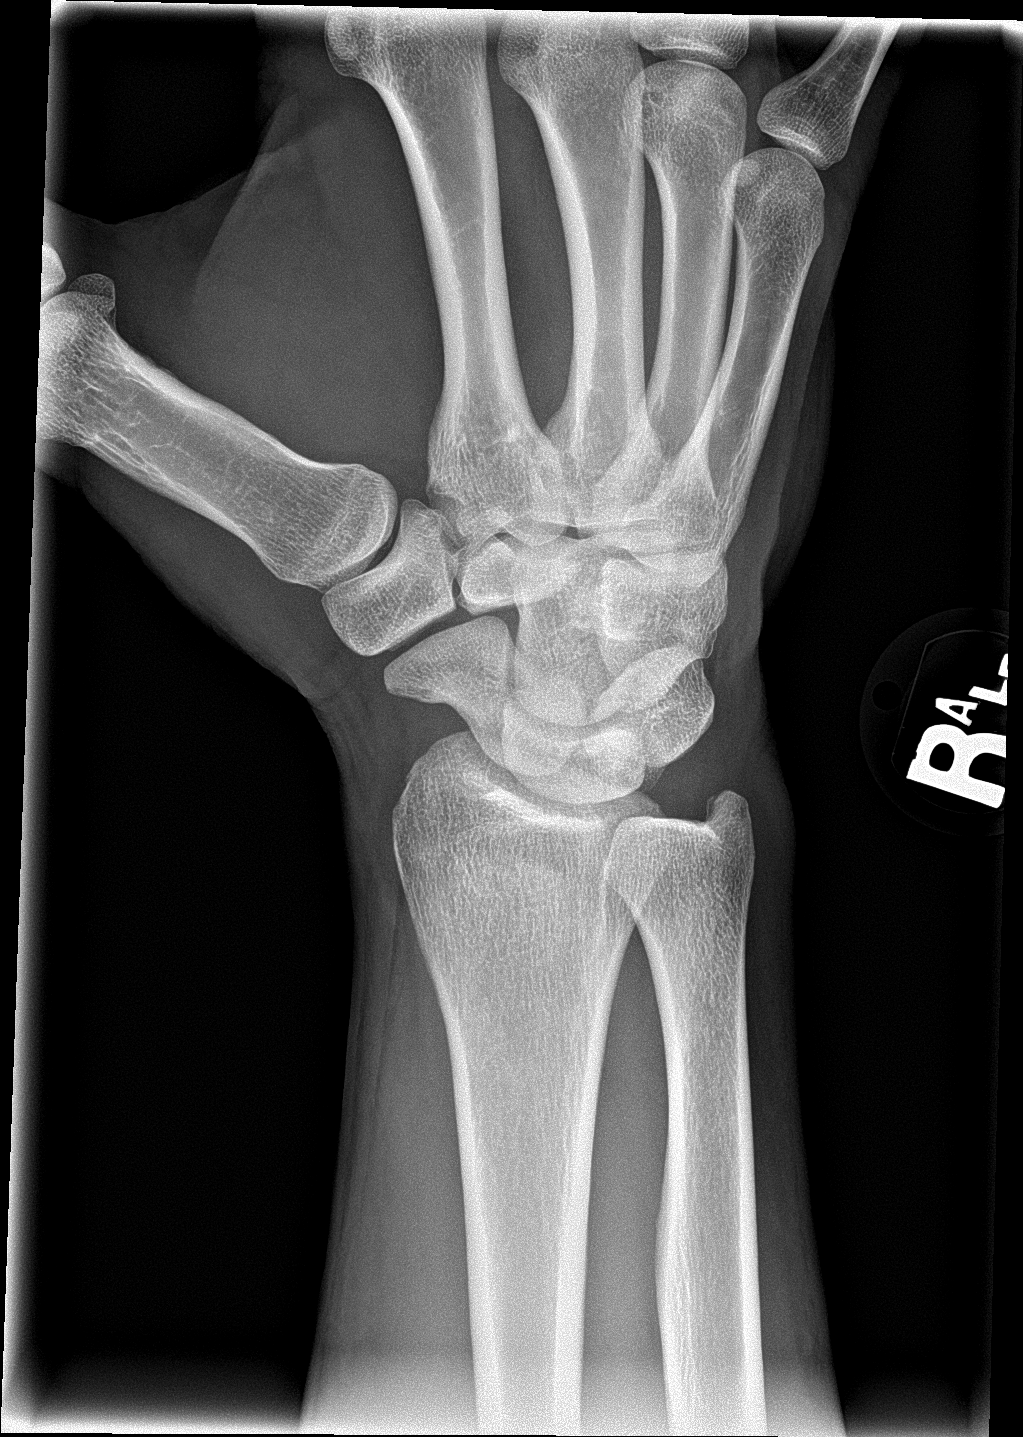

[wrist lat]
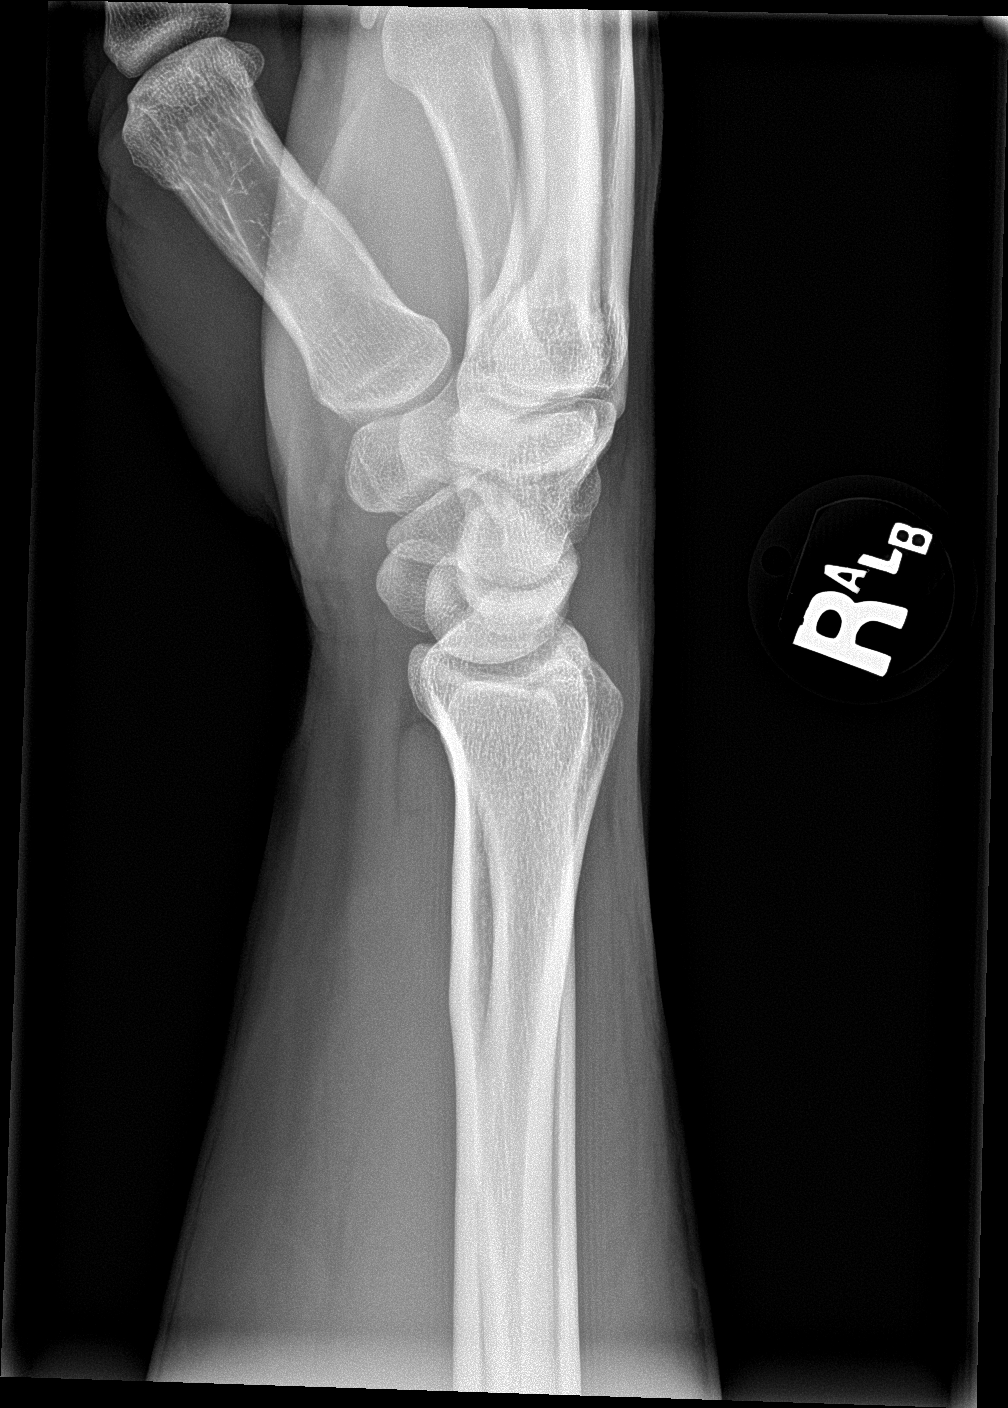

[wrist navicular]
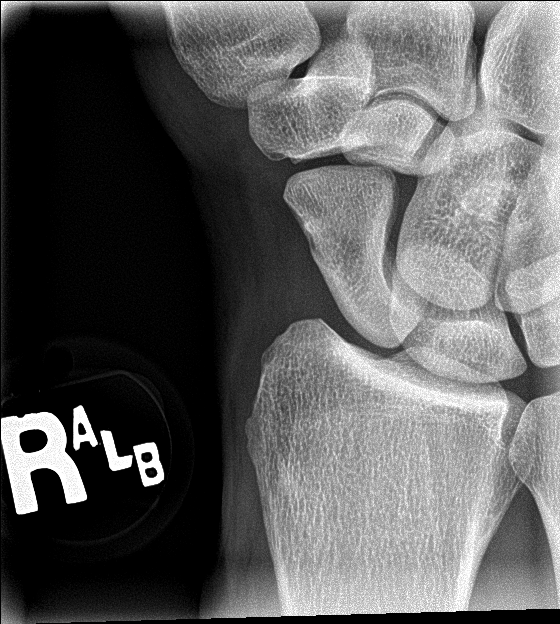

[4 of 4 positions shown; findings below may reference images not displayed]

FINDINGS: There is no evidence of fracture or dislocation. There is no
evidence of arthropathy or other focal bone abnormality. Soft
tissues are unremarkable.
IMPRESSION: No acute abnormality noted.

## 2019-08-24 IMAGING — DX DG ANKLE COMPLETE 3+V*R*
3 series · 3 of 3 positions shown · non-contrast
Comparison: None.

CLINICAL DATA: Heard a pop following twisting injury.

EXAM:
RIGHT ANKLE - COMPLETE 3+ VIEW

[ankle ap]
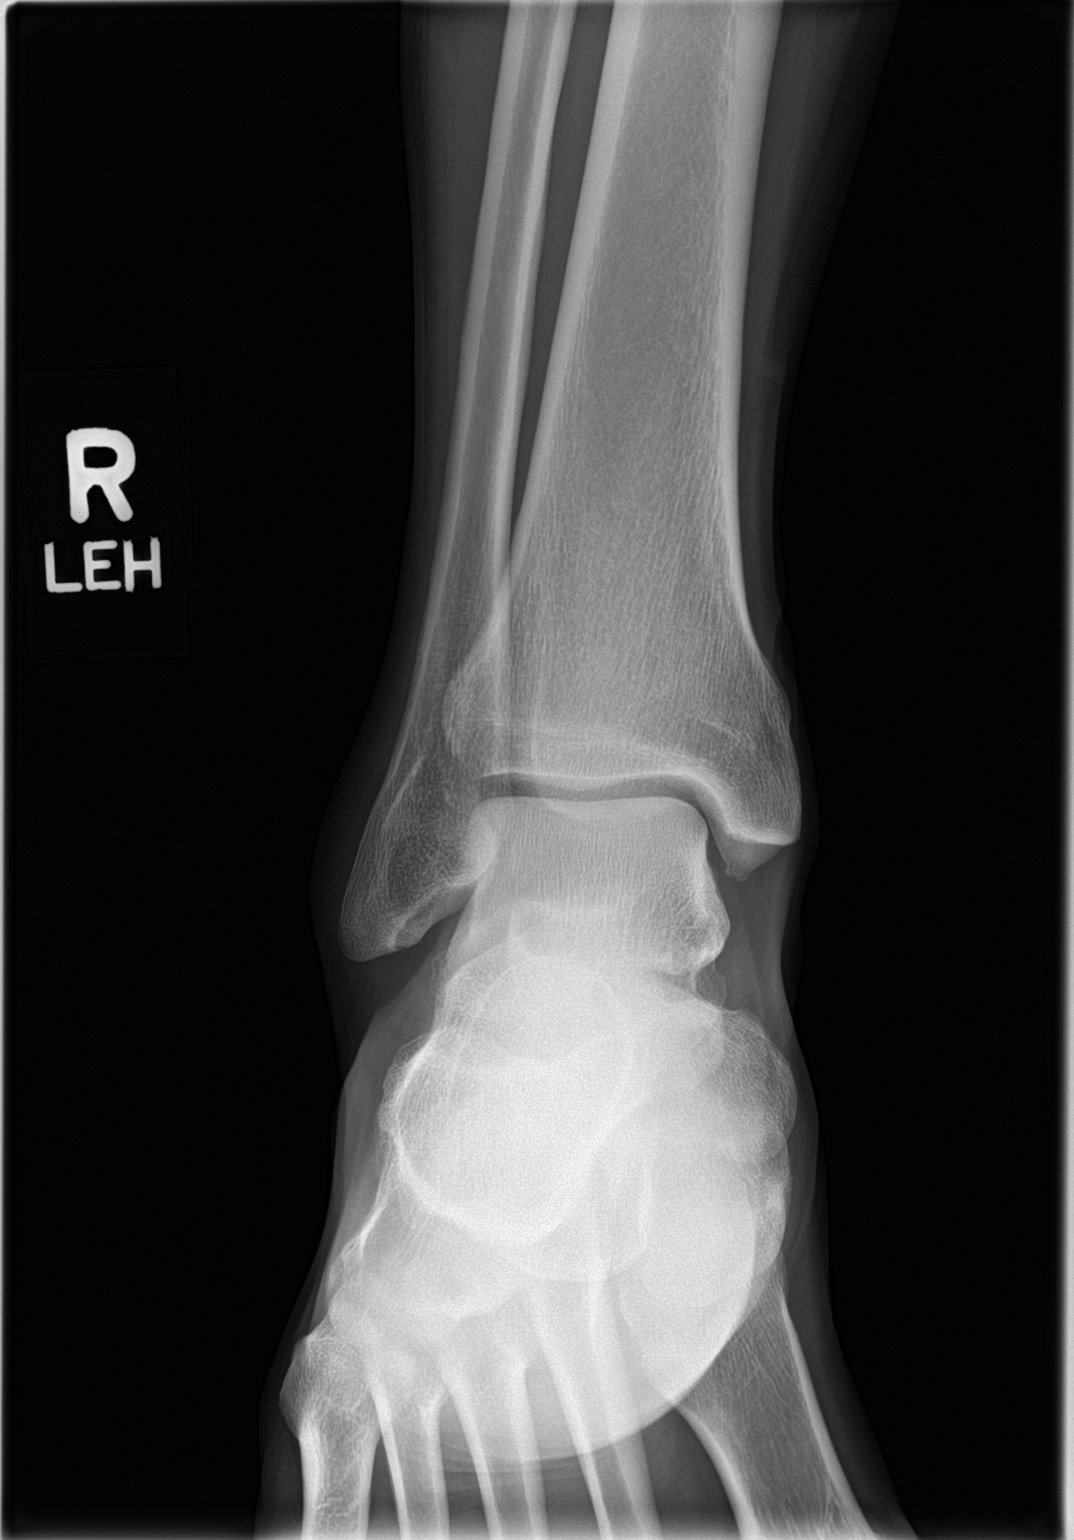

[ankle obl]
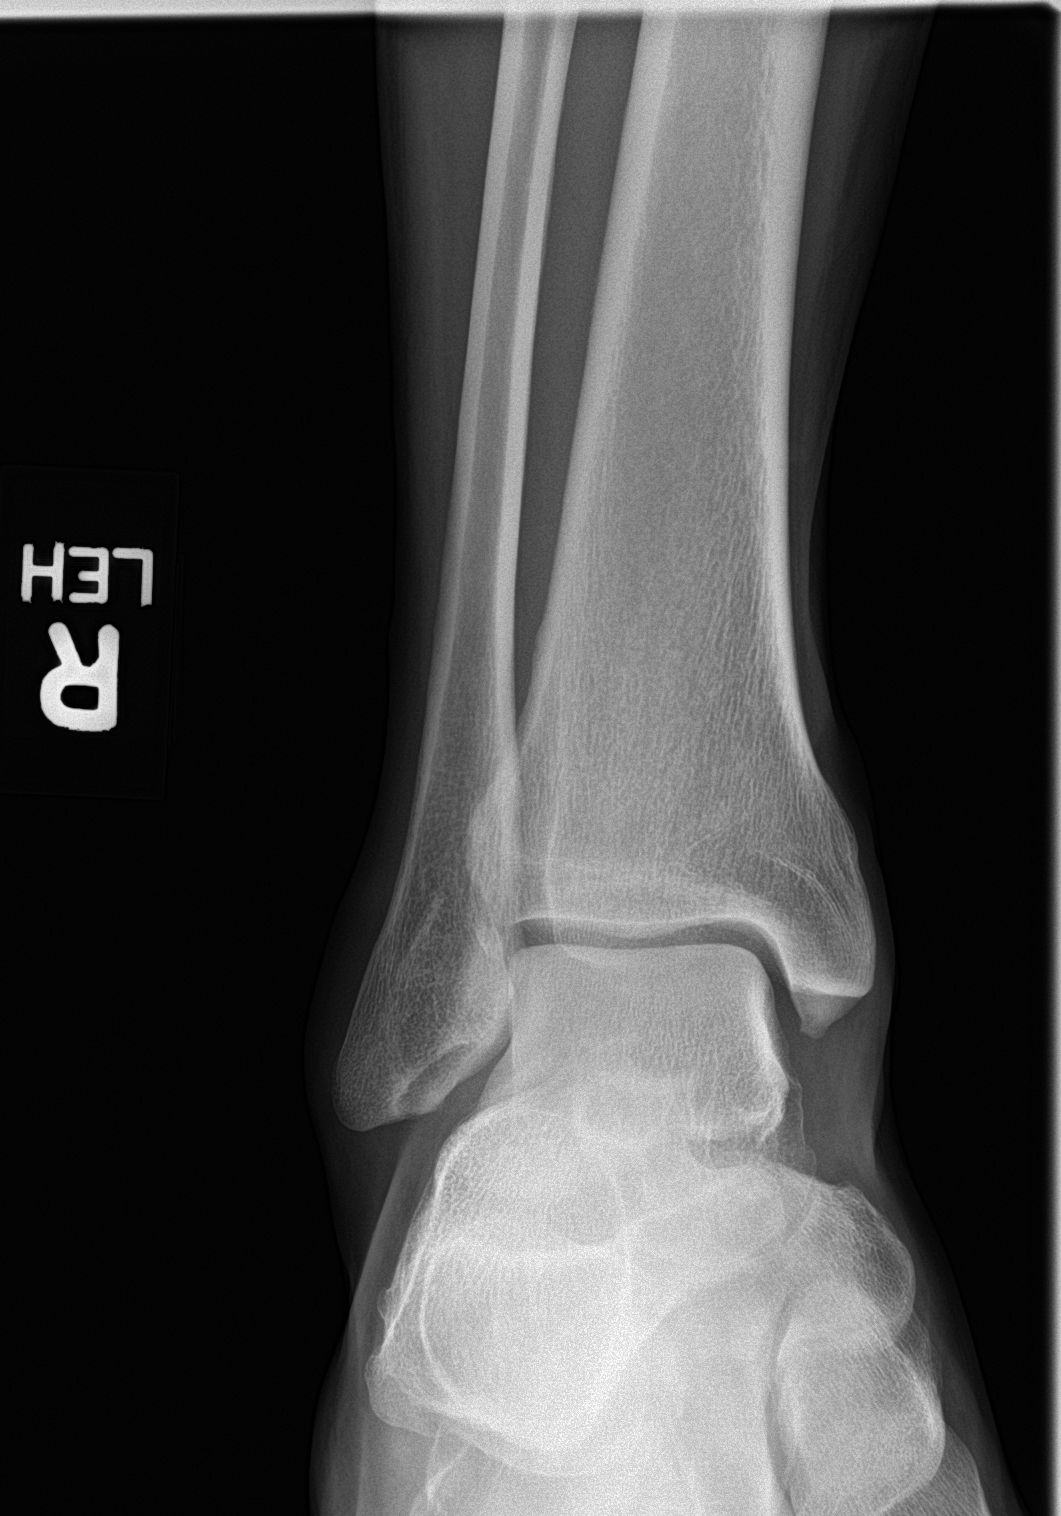

[ankle lat]
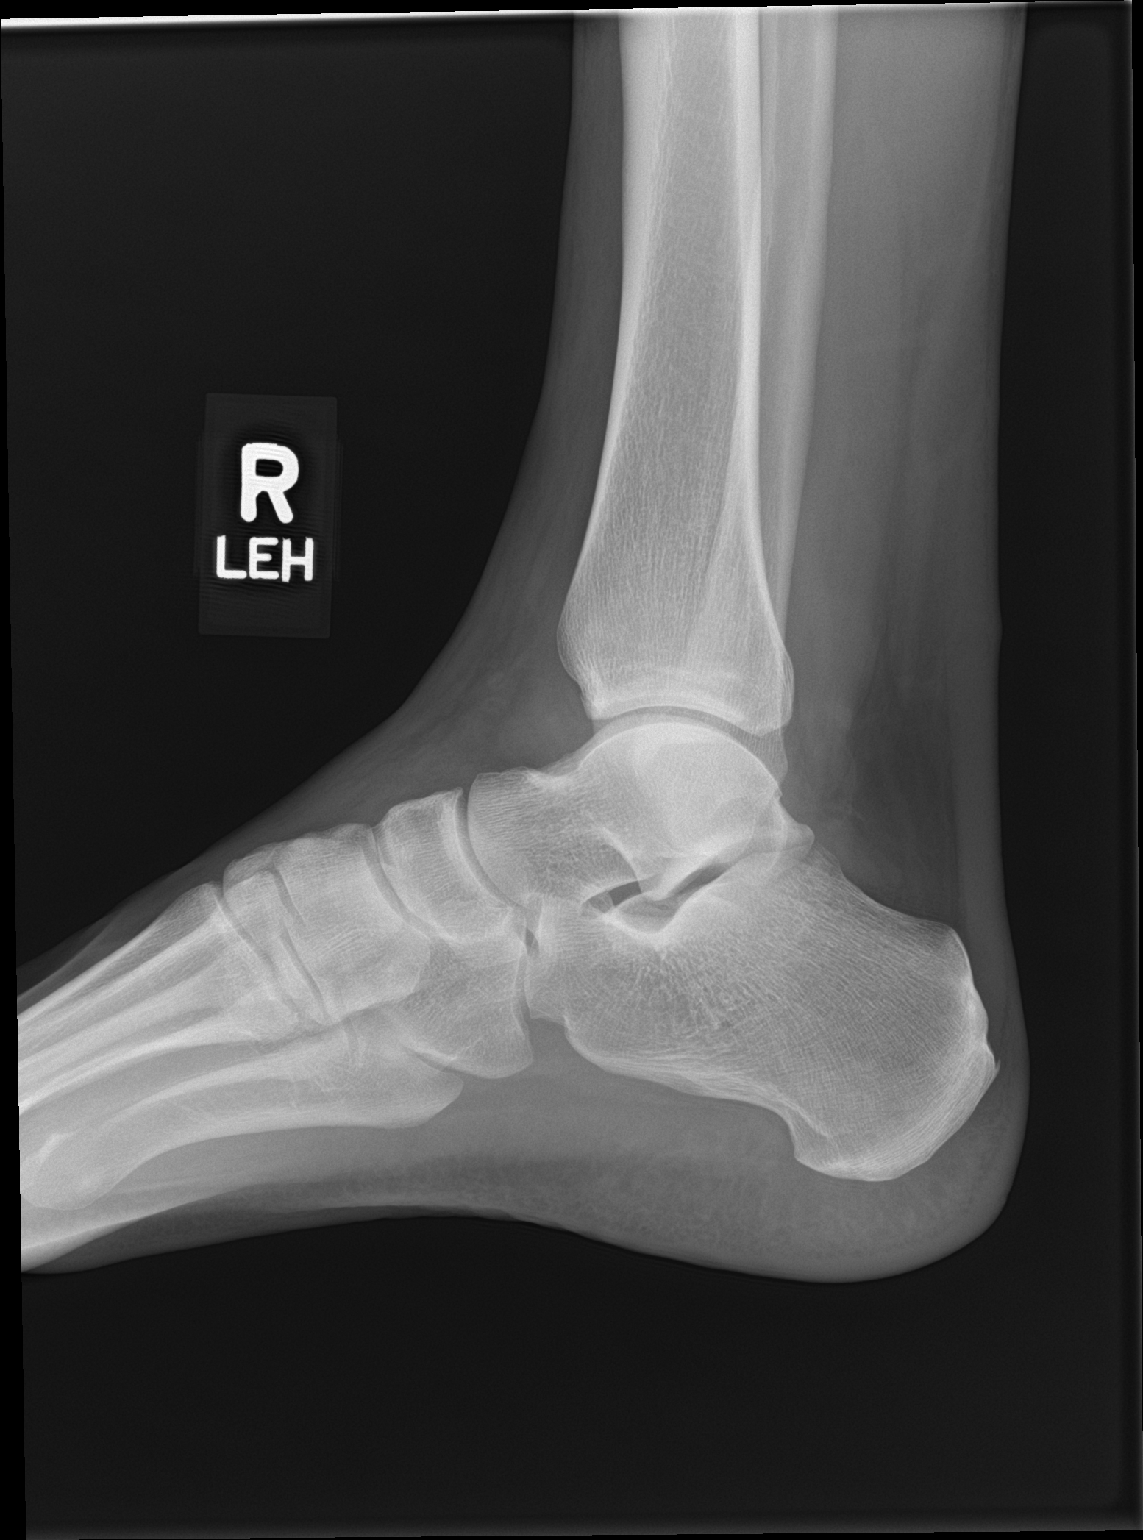

[3 of 3 positions shown; findings below may reference images not displayed]

FINDINGS: Subtle linear lucency in the periphery of the distal tip of the
lateral malleolus seen on one view only which may be projectional
and artifactual versus a subtle nondisplaced fracture given the
overlying soft tissue swelling. There is no other fracture or
dislocation. The ankle mortise is intact. There is a small ankle
joint effusion. The soft tissues are normal.
IMPRESSION: Subtle linear lucency in the periphery of the distal tip of the
lateral malleolus seen on one view only which may be projectional
and artifactual versus a subtle nondisplaced fracture given the
overlying soft tissue swelling. If there is further clinical
concern, a repeat right ankle x-ray can be performed in 7-10 days.

## 2020-02-22 ENCOUNTER — Emergency Department
Admission: EM | Admit: 2020-02-22 | Discharge: 2020-02-22 | Disposition: A | Discharge status: Home / Self Care | Payer: Medicaid Other | Attending: Emergency Medicine | Admitting: Emergency Medicine

## 2020-02-22 ENCOUNTER — Other Ambulatory Visit: Payer: Self-pay

## 2020-02-22 DIAGNOSIS — Z5321 Procedure and treatment not carried out due to patient leaving prior to being seen by health care provider: Secondary | ICD-10-CM | POA: Diagnosis not present

## 2020-02-22 DIAGNOSIS — R109 Unspecified abdominal pain: Secondary | ICD-10-CM | POA: Diagnosis not present

## 2020-02-22 LAB — COMPREHENSIVE METABOLIC PANEL
ALT: 13 U/L (ref 0–44)
AST: 19 U/L (ref 15–41)
Albumin: 3.9 g/dL (ref 3.5–5.0)
Alkaline Phosphatase: 60 U/L (ref 38–126)
Anion gap: 10 (ref 5–15)
BUN: 11 mg/dL (ref 6–20)
CO2: 27 mmol/L (ref 22–32)
Calcium: 8.9 mg/dL (ref 8.9–10.3)
Chloride: 100 mmol/L (ref 98–111)
Creatinine, Ser: 0.88 mg/dL (ref 0.61–1.24)
GFR calc Af Amer: >60 mL/min (ref >60)
GFR calc non Af Amer: >60 mL/min (ref >60)
Glucose, Bld: 104 mg/dL — ABNORMAL HIGH (ref 70–99)
Potassium: 3.8 mmol/L (ref 3.5–5.1)
Sodium: 137 mmol/L (ref 135–145)
Total Bilirubin: 0.5 mg/dL (ref 0.3–1.2)
Total Protein: 6.6 g/dL (ref 6.5–8.1)

## 2020-02-22 LAB — CBC
HCT: 41.5 % (ref 39.0–52.0)
Hemoglobin: 14.1 g/dL (ref 13.0–17.0)
MCH: 31.1 pg (ref 26.0–34.0)
MCHC: 34 g/dL (ref 30.0–36.0)
MCV: 91.6 fL (ref 80.0–100.0)
Platelets: 188 10*3/uL (ref 150–400)
RBC: 4.53 MIL/uL (ref 4.22–5.81)
RDW: 12.7 % (ref 11.5–15.5)
WBC: 7.4 10*3/uL (ref 4.0–10.5)
nRBC: 0 % (ref 0.0–0.2)

## 2020-02-22 LAB — URINALYSIS, COMPLETE (UACMP) WITH MICROSCOPIC
Bacteria, UA: NONE SEEN
Bilirubin Urine: NEGATIVE
Glucose, UA: NEGATIVE mg/dL
Hgb urine dipstick: NEGATIVE
Ketones, ur: NEGATIVE mg/dL
Leukocytes,Ua: NEGATIVE
Nitrite: NEGATIVE
Protein, ur: NEGATIVE mg/dL
Specific Gravity, Urine: 1.015 (ref 1.005–1.030)
Squamous Epithelial / LPF: NONE SEEN (ref 0–5)
pH: 5 (ref 5.0–8.0)

## 2020-02-22 LAB — LIPASE, BLOOD: Lipase: 50 U/L (ref 11–51)

## 2020-02-22 NOTE — ED Triage Notes (Signed)
Pt comes POV with right lower flank/abdominal pain. Pt states pain made him double over. Pt denies n/v/d. Pt states the pain has almost subsided but comes back sometimes.

## 2020-02-23 ENCOUNTER — Telehealth: Payer: Self-pay | Admitting: Emergency Medicine

## 2020-02-23 NOTE — Telephone Encounter (Signed)
Called patient due to lwot to inquire about condition and follow up plans.No answer and voicemail is full. °

## 2022-03-15 ENCOUNTER — Emergency Department
Admission: EM | Admit: 2022-03-15 | Discharge: 2022-03-15 | Disposition: A | Discharge status: Home / Self Care | Payer: Medicaid Other | Attending: Emergency Medicine | Admitting: Emergency Medicine

## 2022-03-15 ENCOUNTER — Other Ambulatory Visit: Payer: Self-pay

## 2022-03-15 DIAGNOSIS — K0381 Cracked tooth: Secondary | ICD-10-CM | POA: Insufficient documentation

## 2022-03-15 DIAGNOSIS — K029 Dental caries, unspecified: Secondary | ICD-10-CM | POA: Insufficient documentation

## 2022-03-15 DIAGNOSIS — F17210 Nicotine dependence, cigarettes, uncomplicated: Secondary | ICD-10-CM | POA: Diagnosis not present

## 2022-03-15 DIAGNOSIS — K0889 Other specified disorders of teeth and supporting structures: Secondary | ICD-10-CM | POA: Diagnosis present

## 2022-03-15 DIAGNOSIS — S025XXA Fracture of tooth (traumatic), initial encounter for closed fracture: Secondary | ICD-10-CM

## 2022-03-15 MED ORDER — CLINDAMYCIN HCL 300 MG PO CAPS: 300.0000 mg | ORAL_CAPSULE | Freq: Three times a day (TID) | ORAL | 0 refills | Status: AC

## 2022-03-15 MED ORDER — IBUPROFEN 600 MG PO TABS: 600.0000 mg | ORAL_TABLET | Freq: Four times a day (QID) | ORAL | 0 refills | Status: AC | PRN

## 2022-03-15 NOTE — ED Notes (Signed)
40 yom with right upper tooth pain for the past two weeks. The pt advised he has not had any antibiotics prior to today.  ?

## 2022-03-15 NOTE — ED Triage Notes (Signed)
Pt c/o right upper tooth ache for the past 2-3 days. No facial swelling noted ?

## 2022-03-15 NOTE — ED Provider Notes (Signed)
? ?[facility] ?Provider Note ? ? ? Event Date/Time  ? First MD Initiated Contact with Patient 03/15/22 1030   ?  (approximate) ? ? ?History  ? ?Dental Pain ? ? ?HPI ? ?Carl Carlson is a 41 y.o. male presents to the ED with complaint of dental pain.  Patient states that he has been bothering him for a long time but he believes that he split his tooth several days ago.  Patient reports that he is a smoker half pack cigarettes per day.  He has a history of bipolar disorder. ?  ? ? ?Physical Exam  ? ?Triage Vital Signs: ?ED Triage Vitals  ?Enc Vitals Group  ?   BP 03/15/22 1013 (!) 145/110  ?   Pulse Rate 03/15/22 1013 85  ?   Resp 03/15/22 1013 18  ?   Temp 03/15/22 1013 98.3 ?F (36.8 ?C)  ?   Temp Source 03/15/22 1013 Oral  ?   SpO2 03/15/22 1013 96 %  ?   Weight 03/15/22 1002 180 lb (81.6 kg)  ?   Height 03/15/22 1002 6\' 3"  (1.905 m)  ?   Head Circumference --   ?   Peak Flow --   ?   Pain Score 03/15/22 1002 10  ?   Pain Loc --   ?   Pain Edu? --   ?   Excl. in GC? --   ? ? ?Most recent vital signs: ?Vitals:  ? 03/15/22 1013  ?BP: (!) 145/110  ?Pulse: 85  ?Resp: 18  ?Temp: 98.3 ?F (36.8 ?C)  ?SpO2: 96%  ? ? ? ?General: Awake, no distress.  ?CV:  Good peripheral perfusion.  Heart regular rate and rhythm. ?Resp:  Normal effort.  Lungs are clear bilaterally. ?Abd:  No distention.  ?Other:  Right upper premolar and poor hygiene and repair.  There does appear to be a fracture and the middle of his tooth.  Gums are edematous but no actual drainage is present. ? ? ?ED Results / Procedures / Treatments  ? ?Labs ?(all labs ordered are listed, but only abnormal results are displayed) ?Labs Reviewed - No data to display ? ? ? ? ?PROCEDURES: ? ?Critical Care performed:  ? ?Procedures ? ? ?MEDICATIONS ORDERED IN ED: ?Medications - No data to display ? ? ?IMPRESSION / MDM / ASSESSMENT AND PLAN / ED COURSE  ?I reviewed the triage vital signs and the nursing notes. ? ? ?Differential diagnosis includes, but  is not limited to, dental pain, dental cary, dental abscess, dental injury. ? ?41 year old male presents to the ED with complaint of right upper dental pain for 2 to 3 days.  Patient's teeth overall are in poor repair and hygiene.  There does appear to be a fracture line in the center of the tooth but no drainage is noted at this time.  Patient was given a prescription for antibiotics and ibuprofen for inflammation.  He is encouraged to make an appointment with the dental clinics listed on his discharge papers. ? ?  ? ? ?FINAL CLINICAL IMPRESSION(S) / ED DIAGNOSES  ? ?Final diagnoses:  ?Pain due to dental caries  ?Closed fracture of tooth, initial encounter  ? ? ? ?Rx / DC Orders  ? ?ED Discharge Orders   ? ?      Ordered  ?  clindamycin (CLEOCIN) 300 MG capsule  3 times daily       ? 03/15/22 1149  ?  ibuprofen (ADVIL) 600 MG tablet  Every 6 hours PRN       ? 03/15/22 1149  ? ?  ?  ? ?  ? ? ? ?Note:  This document was prepared using Dragon voice recognition software and may include unintentional dictation errors. ?  ?Tommi Rumps, PA-C ?03/15/22 1153 ? ?  ?Arnaldo Natal, MD ?03/15/22 1523 ? ?

## 2022-03-15 NOTE — Discharge Instructions (Signed)
Call make an appointment with one of the dental clinics listed on your discharge papers.  Also the clinic at [facility] he will does have walk-in hours.  Call to see what these hours are to take advantage of this.  A prescription for antibiotics and pain and inflammation was sent to your pharmacy.  Decrease smoking as this will also increase pain in your mouth.  Follow-up with your primary care provider to have your blood pressure rechecked as it was slightly elevated at 145/110 when you initially came in. ? ?OPTIONS FOR DENTAL FOLLOW UP CARE ? ?[place] Department of Health and Human Services - Local Safety Net Dental Clinics ?TripDoors.com.htm ?  ?[facility] [phone]) ? ?[employer] [phone]) ? ?[facility] [phone]) ? ?[facility] [phone]) ? ?[facility] [phone]) ?This clinic caters to the indigent population and is on a lottery system. ?Location: ?[employer] [facility] [facility], [employer], [address] [place] ?Clinic Hours: ?Wednesdays from 6pm - 9pm, patients seen by a lottery system. ?For dates, call or go to [email or web address] ?Services: ?Cleanings, fillings and simple extractions. ?Payment Options: ?DENTAL WORK IS FREE OF CHARGE. Bring proof of income or support. ?Best way to get seen: ?Arrive at 5:15 pm - this is a lottery, NOT first come/first serve, so arriving earlier will not increase your chances of being seen. ?  ?  ?[facility] ?[phone] ?Select option 1 for emergencies ?  ?Location: ?[facility] [facility], [employer], [address] [place] ?Clinic Hours: ?No walk-ins accepted - call the day before to schedule an appointment. ?Check in times are 9:30 am and 1:30 pm. ?Services: ?Simple extractions, temporary fillings, pulpectomy/pulp debridement, uncomplicated abscess drainage. ?Payment  Options: ?PAYMENT IS DUE AT THE TIME OF SERVICE.  Fee is usually $100-200, additional surgical procedures (e.g. abscess drainage) may be extra. ?Cash, checks, Visa/MasterCard accepted.  Can file Medicaid if patient is covered for dental - patient should call case worker to check. ?No discount for [facility] patients. ?Best way to get seen: ?MUST call the day before and get onto the schedule. Can usually be seen the next 1-2 days. No walk-ins accepted. ?  ?  ?[facility] ?[phone] ?  ?Location: ?[facility], [address], [place] ?Clinic Hours: ?M, W, Th, F 8am or 1:30pm, Tues 9a or 1:30 - first come/first served. ?Services: ?Simple extractions, temporary fillings, uncomplicated abscess drainage.  You do not need to be an [facility] resident. ?Payment Options: ?PAYMENT IS DUE AT THE TIME OF SERVICE. ?Dental insurance, otherwise sliding scale - bring proof of income or support. ?Depending on income and treatment needed, cost is usually $50-200. ?Best way to get seen: ?Arrive early as it is first come/first served. ?  ?  ?[facility] ?[phone] ?  ?Location: ?[address] ?Clinic Hours: ?Mon-Thu 8a-5p ?Services: ?Most basic dental services including extractions and fillings. ?Payment Options: ?PAYMENT IS DUE AT THE TIME OF SERVICE. ?Sliding scale, up to 50% off - bring proof if income or support. ?Medicaid with dental option accepted. ?Best way to get seen: ?Call to schedule an appointment, can usually be seen within 2 weeks OR they will try to see walk-ins - show up at 8a or 2p (you may have to wait). ?  ?  ?[facility] ?[phone] ?[place] RESIDENTS ONLY ?  ?Location: ?[facility] [employer], [address], [place], [place] [address] ?Clinic  Hours: By appointment only. ?Monday - Thursday 8am-5pm, Friday 8am-12pm ?Services: Cleanings, fillings, extractions. ?Payment Options: ?PAYMENT IS  DUE AT THE TIME OF SERVICE. ?Cash, Visa or MasterCard. Sliding scale - $30 minimum per service. ?Best way to get seen: ?Come in to office, complete packet and make an appointment - need proof of income ?or support monies for each household member and proof of [facility] residence. ?Usually takes about a month to get in. ?  ?  ?[facility] ?[phone] ?  ?Location: ?[address] [facility] ?Clinic Hours: Walk-in Urgent Care Dental Services are offered Monday-Friday mornings only. ?The numbers of emergencies accepted daily is limited to the number of ?providers available. ?Maximum 15 - Mondays, Wednesdays & Thursdays ?Maximum 10 - Tuesdays & Fridays ?Services: ?You do not need to be a [facility] resident to be seen for a dental emergency. ?Emergencies are defined as pain, swelling, abnormal bleeding, or dental trauma. Walkins will receive x-rays if needed. ?NOTE: Dental cleaning is not an emergency. ?Payment Options: ?PAYMENT IS DUE AT THE TIME OF SERVICE. ?Minimum co-pay is $40.00 for uninsured patients. ?Minimum co-pay is $3.00 for Medicaid with dental coverage. ?Dental Insurance is accepted and must be presented at time of visit. ?Medicare does not cover dental. ?Forms of payment: Cash, credit card, checks. ?Best way to get seen: ?If not previously registered with the clinic, walk-in dental registration begins at 7:15 am and is on a first come/first serve basis. ?If previously registered with the clinic, call to make an appointment. ?  ?  ?[facility] ?[phone] ?[place] RESIDENTS ONLY ?  ?Location: ?[address] [place], [place] ?Clinic Hours: ?Mon-Thu 10a-2p ?Services: Extractions only! ?Payment Options: ?FREE (donations accepted) - bring proof of income or support ?Best way to get seen: ?Call and schedule an appointment OR come at 8am on the 1st Monday of every month (except for holidays) when it is first come/first served. ?  ?  ?[facility] ?[phone] ?  ?Location: ?[address] [place] ?Clinic Hours: ?Friday mornings ?Services, Payment Options, Best way to get seen: ?Call for info  ?

## 2022-08-06 ENCOUNTER — Emergency Department: Payer: Medicaid Other

## 2022-08-06 ENCOUNTER — Encounter: Payer: Self-pay | Admitting: Emergency Medicine

## 2022-08-06 ENCOUNTER — Emergency Department
Admission: EM | Admit: 2022-08-06 | Discharge: 2022-08-06 | Disposition: A | Discharge status: Home / Self Care | Payer: Medicaid Other | Attending: Emergency Medicine | Admitting: Emergency Medicine

## 2022-08-06 DIAGNOSIS — Z20822 Contact with and (suspected) exposure to covid-19: Secondary | ICD-10-CM | POA: Insufficient documentation

## 2022-08-06 DIAGNOSIS — J189 Pneumonia, unspecified organism: Secondary | ICD-10-CM

## 2022-08-06 DIAGNOSIS — J181 Lobar pneumonia, unspecified organism: Secondary | ICD-10-CM | POA: Diagnosis not present

## 2022-08-06 DIAGNOSIS — R059 Cough, unspecified: Secondary | ICD-10-CM | POA: Diagnosis present

## 2022-08-06 LAB — URINALYSIS, ROUTINE W REFLEX MICROSCOPIC
Bilirubin Urine: NEGATIVE
Glucose, UA: NEGATIVE mg/dL
Hgb urine dipstick: NEGATIVE
Ketones, ur: NEGATIVE mg/dL
Leukocytes,Ua: NEGATIVE
Nitrite: NEGATIVE
Protein, ur: NEGATIVE mg/dL
Specific Gravity, Urine: 1.008 (ref 1.005–1.030)
pH: 7 (ref 5.0–8.0)

## 2022-08-06 LAB — COMPREHENSIVE METABOLIC PANEL
ALT: 18 U/L (ref 0–44)
AST: 35 U/L (ref 15–41)
Albumin: 2.9 g/dL — ABNORMAL LOW (ref 3.5–5.0)
Alkaline Phosphatase: 72 U/L (ref 38–126)
Anion gap: 8 (ref 5–15)
BUN: 7 mg/dL (ref 6–20)
CO2: 25 mmol/L (ref 22–32)
Calcium: 8.2 mg/dL — ABNORMAL LOW (ref 8.9–10.3)
Chloride: 105 mmol/L (ref 98–111)
Creatinine, Ser: 0.84 mg/dL (ref 0.61–1.24)
GFR, Estimated: >60 mL/min (ref >60)
Glucose, Bld: 100 mg/dL — ABNORMAL HIGH (ref 70–99)
Potassium: 3.6 mmol/L (ref 3.5–5.1)
Sodium: 138 mmol/L (ref 135–145)
Total Bilirubin: 0.8 mg/dL (ref 0.3–1.2)
Total Protein: 5.9 g/dL — ABNORMAL LOW (ref 6.5–8.1)

## 2022-08-06 LAB — CBC WITH DIFFERENTIAL/PLATELET
Abs Immature Granulocytes: 0.04 10*3/uL (ref 0.00–0.07)
Basophils Absolute: 0 10*3/uL (ref 0.0–0.1)
Basophils Relative: 0 %
Eosinophils Absolute: 0.2 10*3/uL (ref 0.0–0.5)
Eosinophils Relative: 1 %
HCT: 40.4 % (ref 39.0–52.0)
Hemoglobin: 13.3 g/dL (ref 13.0–17.0)
Immature Granulocytes: 0 %
Lymphocytes Relative: 12 %
Lymphs Abs: 1.4 10*3/uL (ref 0.7–4.0)
MCH: 29.8 pg (ref 26.0–34.0)
MCHC: 32.9 g/dL (ref 30.0–36.0)
MCV: 90.4 fL (ref 80.0–100.0)
Monocytes Absolute: 1.4 10*3/uL — ABNORMAL HIGH (ref 0.1–1.0)
Monocytes Relative: 12 %
Neutro Abs: 8.8 10*3/uL — ABNORMAL HIGH (ref 1.7–7.7)
Neutrophils Relative %: 75 %
Platelets: 250 10*3/uL (ref 150–400)
RBC: 4.47 MIL/uL (ref 4.22–5.81)
RDW: 12.8 % (ref 11.5–15.5)
WBC: 11.9 10*3/uL — ABNORMAL HIGH (ref 4.0–10.5)
nRBC: 0 % (ref 0.0–0.2)

## 2022-08-06 LAB — SARS CORONAVIRUS 2 BY RT PCR: SARS Coronavirus 2 by RT PCR: NEGATIVE

## 2022-08-06 LAB — PROCALCITONIN: Procalcitonin: 0.11 ng/mL

## 2022-08-06 LAB — LACTIC ACID, PLASMA: Lactic Acid, Venous: 1.3 mmol/L (ref 0.5–1.9)

## 2022-08-06 MED ORDER — PREDNISONE 10 MG (21) PO TBPK: ORAL_TABLET | ORAL | 0 refills | Status: AC

## 2022-08-06 MED ORDER — DOXYCYCLINE HYCLATE 100 MG PO TABS: 100.0000 mg | ORAL_TABLET | Freq: Two times a day (BID) | ORAL | 0 refills | Status: AC

## 2022-08-06 MED ORDER — ONDANSETRON HCL 4 MG/2ML IJ SOLN
4.0000 mg | Freq: Once | INTRAMUSCULAR | Status: AC
  Administered 2022-08-06: 4 mg via INTRAVENOUS
  Filled 2022-08-06: qty 2

## 2022-08-06 MED ORDER — ALBUTEROL SULFATE (2.5 MG/3ML) 0.083% IN NEBU: 2.5000 mg | INHALATION_SOLUTION | Freq: Once | RESPIRATORY_TRACT | Status: DC

## 2022-08-06 MED ORDER — IPRATROPIUM-ALBUTEROL 0.5-2.5 (3) MG/3ML IN SOLN
3.0000 mL | Freq: Once | RESPIRATORY_TRACT | Status: AC
  Administered 2022-08-06: 3 mL via RESPIRATORY_TRACT
  Filled 2022-08-06: qty 3

## 2022-08-06 MED ORDER — STERILE WATER FOR INJECTION IJ SOLN
INTRAMUSCULAR | Status: AC
  Administered 2022-08-06: 2 mL
  Filled 2022-08-06: qty 10

## 2022-08-06 MED ORDER — METHYLPREDNISOLONE SODIUM SUCC 125 MG IJ SOLR
125.0000 mg | Freq: Once | INTRAMUSCULAR | Status: AC
  Administered 2022-08-06: 125 mg via INTRAVENOUS
  Filled 2022-08-06: qty 2

## 2022-08-06 MED ORDER — ALBUTEROL SULFATE HFA 108 (90 BASE) MCG/ACT IN AERS
2.0000 | INHALATION_SPRAY | Freq: Once | RESPIRATORY_TRACT | Status: AC
  Administered 2022-08-06: 2 via RESPIRATORY_TRACT
  Filled 2022-08-06: qty 6.7

## 2022-08-06 MED ORDER — THIAMINE HCL 100 MG/ML IJ SOLN: 100.0000 mg | Freq: Once | INTRAMUSCULAR | Status: DC

## 2022-08-06 MED ORDER — SODIUM CHLORIDE 0.9 % IV SOLN
100.0000 mg | Freq: Once | INTRAVENOUS | Status: AC
  Administered 2022-08-06: 100 mg via INTRAVENOUS
  Filled 2022-08-06: qty 100

## 2022-08-06 MED ORDER — SODIUM CHLORIDE 0.9 % IV BOLUS (SEPSIS)
1000.0000 mL | Freq: Once | INTRAVENOUS | Status: AC
  Administered 2022-08-06: 1000 mL via INTRAVENOUS

## 2022-08-06 MED ORDER — ACETAMINOPHEN 500 MG PO TABS
1000.0000 mg | ORAL_TABLET | Freq: Once | ORAL | Status: AC
  Administered 2022-08-06: 1000 mg via ORAL
  Filled 2022-08-06: qty 2

## 2022-08-06 MED ORDER — ONDANSETRON 4 MG PO TBDP: 4.0000 mg | ORAL_TABLET | Freq: Four times a day (QID) | ORAL | 0 refills | Status: AC | PRN

## 2022-08-06 NOTE — Discharge Instructions (Addendum)
You may alternate Tylenol 1000 mg every 6 hours as needed for pain, fever and Ibuprofen 800 mg every 6-8 hours as needed for pain, fever.  Please take Ibuprofen with food.  Do not take more than 4000 mg of Tylenol (acetaminophen) in a 24 hour period.  You may use your albuterol inhaler 2 to 4 puffs every 2-4 hours as needed for shortness of breath and wheezing.  Please start your antibiotics tomorrow morning 8/31.  You received your first dose IV today in the hospital.  Please take them twice a day for a total of 5 days.  You may use over the counter Robitussin as needed for cough.  Your work-up showed a right lower lobe pneumonia.  Your COVID test was negative.

## 2022-08-06 NOTE — ED Provider Notes (Signed)
[facility] Provider Note    Event Date/Time   First MD Initiated Contact with Patient 08/06/22 0215     (approximate)   History   Cough and Fever   HPI  Carl Carlson is a 41 y.o. male with history of bipolar disorder who presents to the emergency department with fevers, chills, cough, shortness of breath and wheezing.  States symptoms started on Friday, August 25.  Did have some vomiting yesterday but no diarrhea.  No dysuria.  No known sick contacts or travel.  Took 600 mg of ibuprofen prior to arrival.  States he thinks he has pneumonia.  States he is a smoker but denies history of asthma or COPD.   History provided by patient and EMS.    Past Medical History:  Diagnosis Date   Bipolar 1 disorder (HCC)     History reviewed. No pertinent surgical history.  MEDICATIONS:  Prior to Admission medications   Medication Sig Start Date End Date Taking? Authorizing Provider  ibuprofen (ADVIL) 600 MG tablet Take 1 tablet (600 mg total) by mouth every 6 (six) hours as needed. 03/15/22   Tommi Rumps, PA-C    Physical Exam   Triage Vital Signs: ED Triage Vitals  Enc Vitals Group     BP 08/06/22 0215 124/76     Pulse Rate 08/06/22 0215 75     Resp 08/06/22 0215 (!) 21     Temp 08/06/22 0215 (!) 100.6 F (38.1 C)     Temp Source 08/06/22 0215 Oral     SpO2 08/06/22 0215 98 %     Weight --      Height 08/06/22 0216 6\' 3"  (1.905 m)     Head Circumference --      Peak Flow --      Pain Score --      Pain Loc --      Pain Edu? --      Excl. in GC? --     Most recent vital signs: Vitals:   08/06/22 0330 08/06/22 0430  BP: 118/73 122/71  Pulse: 76 79  Resp: (!) 25 (!) 21  Temp:    SpO2: 97% 91%    CONSTITUTIONAL: Alert and oriented and responds appropriately to questions. Well-appearing; well-nourished HEAD: Normocephalic, atraumatic EYES: Conjunctivae clear, pupils appear equal, sclera nonicteric ENT: normal nose; moist mucous  membranes NECK: Supple, normal ROM CARD: RRR; S1 and S2 appreciated; no murmurs, no clicks, no rubs, no gallops RESP: Patient is slightly tachypneic.  Expiratory wheezing heard at bases bilaterally.  No rhonchi or rales.  No respiratory distress.  No hypoxia.  Speaking full sentences. ABD/GI: Normal bowel sounds; non-distended; soft, non-tender, no rebound, no guarding, no peritoneal signs BACK: The back appears normal EXT: Normal ROM in all joints; no deformity noted, no edema; no cyanosis SKIN: Normal color for age and race; warm; no rash on exposed skin NEURO: Moves all extremities equally, normal speech PSYCH: The patient's mood and manner are appropriate.   ED Results / Procedures / Treatments   LABS: (all labs ordered are listed, but only abnormal results are displayed) Labs Reviewed  CBC WITH DIFFERENTIAL/PLATELET - Abnormal; Notable for the following components:      Result Value   WBC 11.9 (*)    Neutro Abs 8.8 (*)    Monocytes Absolute 1.4 (*)    All other components within normal limits  COMPREHENSIVE METABOLIC PANEL - Abnormal; Notable for the following components:   Glucose, Bld  100 (*)    Calcium 8.2 (*)    Total Protein 5.9 (*)    Albumin 2.9 (*)    All other components within normal limits  URINALYSIS, ROUTINE W REFLEX MICROSCOPIC - Abnormal; Notable for the following components:   Color, Urine YELLOW (*)    APPearance CLEAR (*)    All other components within normal limits  SARS CORONAVIRUS 2 BY RT PCR  CULTURE, BLOOD (ROUTINE X 2)  CULTURE, BLOOD (ROUTINE X 2)  URINE CULTURE  LACTIC ACID, PLASMA  PROCALCITONIN     EKG:  EKG Interpretation  Date/Time:  Wednesday August 06 2022 03:14:15 EDT Ventricular Rate:  71 PR Interval:  122 QRS Duration: 92 QT Interval:  383 QTC Calculation: 417 R Axis:   66 Text Interpretation: Sinus rhythm Confirmed by Rochele Raring [phone]) on 08/06/2022 3:22:36 AM         RADIOLOGY: My personal review and  interpretation of imaging: Chest x-ray shows a right lower lobe pneumonia.  I have personally reviewed all radiology reports.   DG Chest Portable 1 View  Result Date: 08/06/2022 CLINICAL DATA:  Cough and fevers for several days, initial encounter EXAM: PORTABLE CHEST 1 VIEW COMPARISON:  05/24/2015 FINDINGS: Cardiac shadow is within normal limits. Lungs are well aerated bilaterally. Focal infiltrate is noted in the medial right lung base. The remainder of the lungs are clear. No bony abnormality is noted. IMPRESSION: Focal infiltrate in the medial right lung base. Electronically Signed   By: Alcide Clever M.D.   On: 08/06/2022 02:36     PROCEDURES:  Critical Care performed: YES   CRITICAL CARE Performed by: Baxter Hire Mckinze Poirier   Total critical care time: 35 minutes  Critical care time was exclusive of separately billable procedures and treating other patients.  Critical care was necessary to treat or prevent imminent or life-threatening deterioration.  Critical care was time spent personally by me on the following activities: development of treatment plan with patient and/or surrogate as well as nursing, discussions with consultants, evaluation of patient's response to treatment, examination of patient, obtaining history from patient or surrogate, ordering and performing treatments and interventions, ordering and review of laboratory studies, ordering and review of radiographic studies, pulse oximetry and re-evaluation of patient's condition.   Marland Kitchen1-3 Lead EKG Interpretation  Performed by: Sueann Brownley, Layla Maw, DO Authorized by: Marillyn Goren, Layla Maw, DO     Interpretation: normal     ECG rate:  75   ECG rate assessment: normal     Rhythm: sinus rhythm     Ectopy: none     Conduction: normal       IMPRESSION / MDM / ASSESSMENT AND PLAN / ED COURSE  I reviewed the triage vital signs and the nursing notes.    Patient here with fevers, chills, cough.  States he feels like he has pneumonia.  He is  a smoker but denies history of asthma or COPD.  The patient is on the cardiac monitor to evaluate for evidence of arrhythmia and/or significant heart rate changes.   DIFFERENTIAL DIAGNOSIS (includes but not limited to):   Viral URI, bacterial pneumonia, sepsis, bronchospasm, COPD exacerbation   Patient's presentation is most consistent with acute presentation with potential threat to life or bodily function.   PLAN: We will obtain CBC, CMP, lactic, procalcitonin, blood cultures, COVID swab, chest x-ray.  Will give IV fluids, DuoNeb, Solu-Medrol, Zofran for symptomatic relief.   MEDICATIONS GIVEN IN ED: Medications  albuterol (VENTOLIN HFA) 108 (90 Base) MCG/ACT inhaler  2 puff (has no administration in time range)  acetaminophen (TYLENOL) tablet 1,000 mg (1,000 mg Oral Given 08/06/22 0247)  sodium chloride 0.9 % bolus 1,000 mL (0 mLs Intravenous Stopped 08/06/22 0346)  ondansetron (ZOFRAN) injection 4 mg (4 mg Intravenous Given 08/06/22 0248)  ipratropium-albuterol (DUONEB) 0.5-2.5 (3) MG/3ML nebulizer solution 3 mL (3 mLs Nebulization Given 08/06/22 0249)  methylPREDNISolone sodium succinate (SOLU-MEDROL) 125 mg/2 mL injection 125 mg (125 mg Intravenous Given 08/06/22 0249)  doxycycline (VIBRAMYCIN) 100 mg in sodium chloride 0.9 % 250 mL IVPB (100 mg Intravenous New Bag/Given 08/06/22 0310)  sterile water (preservative free) injection (2 mLs  Given 08/06/22 0249)     ED COURSE: Patient's labs show leukocytosis of 11.9 with left shift.  Chest x-ray reviewed and interpreted by myself and the radiologist and shows right lower lobe pneumonia.  Will give doxycycline.  COVID pending.  Lactic normal.  COVID-negative.  Patient reports feeling "much better".  Lungs now clear to auscultation and no hypoxia at rest or with ambulation.  He has been febrile, tachypneic and does have a slight leukocytosis and meets sepsis criteria but is extremely well-appearing and nontoxic here.  I feel because he is  young, otherwise healthy that he is safe for discharge with oral antibiotics.  He is allergic to penicillins and cephalosporins so we have given him doxycycline for community-acquired pneumonia.  We will also discharge with albuterol inhaler and steroid taper for his bronchospasm.  Tolerating p.o. here.     Urine shows no sign of infection.  Will discharge home.   At this time, I do not feel there is any life-threatening condition present. I reviewed all nursing notes, vitals, pertinent previous records.  All lab and urine results, EKGs, imaging ordered have been independently reviewed and interpreted by myself.  I reviewed all available radiology reports from any imaging ordered this visit.  Based on my assessment, I feel the patient is safe to be discharged home without further emergent workup and can continue workup as an outpatient as needed. Discussed all findings, treatment plan as well as usual and customary return precautions.  They verbalize understanding and are comfortable with this plan.  Outpatient follow-up has been provided as needed.  All questions have been answered.   CONSULTS: Admission considered but patient is feeling much better and his lungs are clear without hypoxia and he is otherwise young, healthy without significant comorbidities other than tobacco use and I feel he is appropriate for oral antibiotics and further outpatient management.  He is also comfortable with this plan.     OUTSIDE RECORDS REVIEWED: Reviewed patient's last podiatry note on 02/09/2018.       FINAL CLINICAL IMPRESSION(S) / ED DIAGNOSES   Final diagnoses:  Pneumonia of right lower lobe due to infectious organism     Rx / DC Orders   ED Discharge Orders          Ordered    ondansetron (ZOFRAN-ODT) 4 MG disintegrating tablet  Every 6 hours PRN        08/06/22 0419    doxycycline (VIBRA-TABS) 100 MG tablet  2 times daily        08/06/22 0419    predniSONE (STERAPRED UNI-PAK 21 TAB) 10 MG  (21) TBPK tablet        08/06/22 0419             Note:  This document was prepared using Dragon voice recognition software and may include unintentional dictation errors.   Eliora Nienhuis, Baxter Hire  N, DO 08/06/22 2957

## 2022-08-06 NOTE — ED Triage Notes (Signed)
Pt arrived via [facility] from home with c/o productive cough, fever and chills x3 days. Pt denies taking OTC meds for relief until 1 hour prior to EMS arrival when pt took a 600mg  Ibuprofen.

## 2022-08-06 NOTE — ED Notes (Signed)
Patient ambulated in hallway with steady gait. Oxygen saturation 99-100% during walk. No distress noted.

## 2022-08-07 LAB — URINE CULTURE: Culture: NO GROWTH

## 2022-08-11 LAB — CULTURE, BLOOD (ROUTINE X 2)
Culture: NO GROWTH
Culture: NO GROWTH
Special Requests: ADEQUATE
Special Requests: ADEQUATE

## 2022-10-19 ENCOUNTER — Other Ambulatory Visit: Payer: Self-pay

## 2022-10-19 ENCOUNTER — Emergency Department
Admission: EM | Admit: 2022-10-19 | Discharge: 2022-10-19 | Disposition: A | Discharge status: Home / Self Care | Payer: Medicaid Other | Attending: Emergency Medicine | Admitting: Emergency Medicine

## 2022-10-19 DIAGNOSIS — T310 Burns involving less than 10% of body surface: Secondary | ICD-10-CM | POA: Insufficient documentation

## 2022-10-19 DIAGNOSIS — X100XXA Contact with hot drinks, initial encounter: Secondary | ICD-10-CM | POA: Diagnosis not present

## 2022-10-19 DIAGNOSIS — T25221A Burn of second degree of right foot, initial encounter: Secondary | ICD-10-CM | POA: Insufficient documentation

## 2022-10-19 DIAGNOSIS — Z23 Encounter for immunization: Secondary | ICD-10-CM | POA: Diagnosis not present

## 2022-10-19 MED ORDER — OXYCODONE-ACETAMINOPHEN 5-325 MG PO TABS: 1.0000 | ORAL_TABLET | Freq: Three times a day (TID) | ORAL | 0 refills | Status: AC | PRN

## 2022-10-19 MED ORDER — BACITRACIN ZINC 500 UNIT/GM EX OINT
TOPICAL_OINTMENT | Freq: Once | CUTANEOUS | Status: AC
  Administered 2022-10-19: 5 via TOPICAL
  Filled 2022-10-19: qty 2.7

## 2022-10-19 MED ORDER — TETANUS-DIPHTH-ACELL PERTUSSIS 5-2.5-18.5 LF-MCG/0.5 IM SUSY
0.5000 mL | PREFILLED_SYRINGE | Freq: Once | INTRAMUSCULAR | Status: AC
  Administered 2022-10-19: 0.5 mL via INTRAMUSCULAR
  Filled 2022-10-19: qty 0.5

## 2022-10-19 MED ORDER — BACITRACIN 500 UNIT/GM EX OINT: 1.0000 | TOPICAL_OINTMENT | Freq: Two times a day (BID) | CUTANEOUS | 0 refills | Status: AC
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
## 2022-10-19 NOTE — ED Notes (Signed)
Sister called, Carl Carlson, call for ride when discharged [phone].
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
